# Patient Record
Sex: Female | Born: 1990 | Race: White | Hispanic: No | Marital: Single | Smoking: Current every day smoker
Health system: Southern US, Community
[De-identification: ages and names within clinical notes are randomized; demographics above are authoritative.]

## PROBLEM LIST (undated history)

## (undated) DIAGNOSIS — R51 Headache: Secondary | ICD-10-CM

## (undated) DIAGNOSIS — F329 Major depressive disorder, single episode, unspecified: Secondary | ICD-10-CM

## (undated) DIAGNOSIS — M1990 Unspecified osteoarthritis, unspecified site: Secondary | ICD-10-CM

## (undated) DIAGNOSIS — F603 Borderline personality disorder: Secondary | ICD-10-CM

## (undated) DIAGNOSIS — K219 Gastro-esophageal reflux disease without esophagitis: Secondary | ICD-10-CM

## (undated) DIAGNOSIS — F4310 Post-traumatic stress disorder, unspecified: Secondary | ICD-10-CM

## (undated) DIAGNOSIS — F319 Bipolar disorder, unspecified: Secondary | ICD-10-CM

## (undated) DIAGNOSIS — C801 Malignant (primary) neoplasm, unspecified: Secondary | ICD-10-CM

## (undated) DIAGNOSIS — F32A Depression, unspecified: Secondary | ICD-10-CM

## (undated) DIAGNOSIS — J45909 Unspecified asthma, uncomplicated: Secondary | ICD-10-CM

## (undated) HISTORY — PX: CHOLECYSTECTOMY: SHX55

## (undated) HISTORY — PX: WISDOM TOOTH EXTRACTION: SHX21

## (undated) HISTORY — PX: LEFT OOPHORECTOMY: SHX1961

## (undated) HISTORY — PX: NO PAST SURGERIES: SHX2092

---

## 2011-01-22 ENCOUNTER — Encounter: Payer: Self-pay | Admitting: *Deleted

## 2011-01-22 DIAGNOSIS — R109 Unspecified abdominal pain: Secondary | ICD-10-CM | POA: Insufficient documentation

## 2011-01-22 NOTE — ED Notes (Signed)
Pt in c/o generalized abd pain, worse is RUQ, last period was in July, pt has not taken pregnancy test

## 2011-01-23 ENCOUNTER — Emergency Department (HOSPITAL_COMMUNITY)
Admission: EM | Admit: 2011-01-23 | Discharge: 2011-01-23 | Discharge status: Against Medical Advice | Payer: BC Managed Care – PPO | Attending: Emergency Medicine | Admitting: Emergency Medicine

## 2011-01-23 HISTORY — DX: Depression, unspecified: F32.A

## 2011-01-23 HISTORY — DX: Major depressive disorder, single episode, unspecified: F32.9

## 2013-02-10 NOTE — L&D Delivery Note (Signed)
Delivery Note Called for standby delivery. Patient recently brought up from MAU with advanced dilation.   She was involuntarily pushing and screaming. FHR was noted to have decelerations which occurred after contractions (which were not tracing well, but the timing was observed after contractions noted by me).  Turned to other side, IV bolus, and oxygen administered. ISE placed. Good acceleration with application/scalp stimulation.  Artelia Laroche notified, and Dr Garwin Brothers called.  Spoke with Dr Garwin Brothers on phone.    Shortly after patient turned, vertex rotated from LOT to direct OA, and began to crown.  At 9:36 PM a viable and healthy female was delivered via Vaginal, Spontaneous Delivery (Presentation: Middle Occiput Anterior).  APGAR: 8, 9; weight .   Placenta status: Intact, Spontaneous.  Cord: 3 vessels with the following complications: Nuchal cord x 2.    Anesthesia:  none Episiotomy: none Lacerations: 2nd degree Suture Repair: Suturing/Repair done by T. Mel Almond CNM Est. Blood Loss (mL): 200  Mom to postpartum.  Baby to Couplet care / Skin to Skin.  [facility] 10/22/2013, 9:47 PM    Addendum:   In another delivery with patient arrival - stand-by by in-house faculty practice CNM. Dr Garwin Brothers called by nursing staff -notified of impending birth / FHR decels / CNM in birth. Assumed care after delivery of placenta. Perineal assessment - 2nd degree laceration. 1% xylocaine local anesthesia for repair. 3-0 vicryl vaginal and deep perineal followed by 4-0 vicryl subcuticular repair.  + GBS not treated due to advanced dilation  / FSE applied by faculty CNM - nursery aware   Artelia Laroche CNM [facility]

## 2013-09-21 LAB — OB RESULTS CONSOLE HIV ANTIBODY (ROUTINE TESTING): HIV: NONREACTIVE

## 2013-09-21 LAB — OB RESULTS CONSOLE RPR: RPR: NONREACTIVE

## 2013-09-21 LAB — OB RESULTS CONSOLE GC/CHLAMYDIA
Chlamydia: NEGATIVE
Gonorrhea: NEGATIVE

## 2013-09-21 LAB — OB RESULTS CONSOLE RUBELLA ANTIBODY, IGM: Rubella: IMMUNE

## 2013-09-21 LAB — OB RESULTS CONSOLE HEPATITIS B SURFACE ANTIGEN: Hepatitis B Surface Ag: NEGATIVE

## 2013-09-21 LAB — OB RESULTS CONSOLE ABO/RH: RH Type: POSITIVE

## 2013-09-21 LAB — OB RESULTS CONSOLE ANTIBODY SCREEN: Antibody Screen: NEGATIVE

## 2013-10-04 ENCOUNTER — Inpatient Hospital Stay (HOSPITAL_COMMUNITY)
Admission: AD | Admit: 2013-10-04 | Discharge: 2013-10-05 | Disposition: A | Discharge status: Home / Self Care | Payer: BC Managed Care – PPO | Source: Ambulatory Visit | Attending: Obstetrics & Gynecology | Admitting: Obstetrics & Gynecology

## 2013-10-04 ENCOUNTER — Encounter (HOSPITAL_COMMUNITY): Payer: Self-pay | Admitting: *Deleted

## 2013-10-04 DIAGNOSIS — Z87891 Personal history of nicotine dependence: Secondary | ICD-10-CM | POA: Insufficient documentation

## 2013-10-04 DIAGNOSIS — O479 False labor, unspecified: Secondary | ICD-10-CM | POA: Diagnosis not present

## 2013-10-04 HISTORY — DX: Bipolar disorder, unspecified: F31.9

## 2013-10-04 HISTORY — DX: Headache: R51

## 2013-10-04 HISTORY — DX: Unspecified asthma, uncomplicated: J45.909

## 2013-10-04 LAB — WET PREP, GENITAL
Clue Cells Wet Prep HPF POC: NONE SEEN
Trich, Wet Prep: NONE SEEN
Yeast Wet Prep HPF POC: NONE SEEN

## 2013-10-04 LAB — AMNISURE RUPTURE OF MEMBRANE (ROM) NOT AT ARMC: Amnisure ROM: NEGATIVE

## 2013-10-04 NOTE — MAU Provider Note (Signed)
Chief Complaint:  Labor Eval   Ashley Moyer is a 23 y.o.  G1P0 with IUP at [redacted]w[redacted]d presenting for Labor Eval . Patient states she has been having  irregular contractions since 9am today, none vaginal bleeding, continuous leaking of fluid since this afternoon, with active fetal movement.  She report hx of PTL prior to transferring her care to [facility] - but states she was treated with two shots. She denies any fevers, chills, dysuria, vaginal pain/discharge/bleeding.   Menstrual History: OB History   Grav Para Term Preterm Abortions TAB SAB Ect Mult Living   1               No LMP recorded. Patient is pregnant.      Past Medical History  Diagnosis Date  . Depression   . Asthma   . Headache(784.0)   . Bipolar 1 disorder     Past Surgical History  Procedure Laterality Date  . No past surgeries      Family History  Problem Relation Age of Onset  . Cancer Mother   . Cancer Maternal Aunt   . Cancer Maternal Grandmother     History  Substance Use Topics  . Smoking status: Former Research scientist (life sciences)  . Smokeless tobacco: Not on file  . Alcohol Use: No     No Known Allergies  Prescriptions prior to admission  Medication Sig Dispense Refill  . OXcarbazepine (TRILEPTAL PO) Take by mouth.          Review of Systems - Negative except for what is mentioned in HPI.  Physical Exam  Blood pressure 117/71, pulse 83, temperature 99 F (37.2 C), temperature source Oral, height 5\' 6"  (1.676 m), weight 114.306 kg (252 lb). GENERAL: Well-developed, well-nourished female in no acute distress.  LUNGS: Clear to auscultation bilaterally.  HEART: Regular rate and rhythm. ABDOMEN: Soft, nontender, nondistended, gravid.  EXTREMITIES: Nontender, no edema, 2+ distal pulses. FHT:  Baseline rate 145 bpm   Variability moderate  Accelerations present   Decelerations none Contractions: Irregular; > 10 mins Speculum Exam: Ext genitalia: wnl; Vaginal discharge: thick white; Cervix: wnl w/o leaking of  fluid Dilation: Fingertip Effacement (%): 50 Station: -3 Presentation: Vertex Exam by:: Ashley Glassing RN   Labs: No results found for this or any previous visit (from the past 24 hour(s)).  Imaging Studies:  No results found.  Assessment: Ashley Moyer is  23 y.o. G1P0 at [redacted]w[redacted]d presents with Labor Ashley Moyer, Speculum and Amnisure all negative. FHT is Cat 1. No cervical change x 1 hour. Wet mount negative  Plan: Braxton-Hicks contractions - Follow-up with regular OB visit/prenatal care  Ashley Moyer 8/25/201511:02 PM  OB fellow attestation:  I have seen and examined this patient; I agree with above documentation in the resident's note.   Ashley Moyer is a 23 y.o. G1P0 reporting leakage of fluid +FM, denies LOF, VB, contractions, vaginal discharge.  PE: BP 122/66  Pulse 72  Temp(Src) 97.9 F (36.6 C) (Oral)  Resp 18  Ht 5\' 6"  (1.676 m)  Wt 252 lb (114.306 kg)  BMI 40.69 kg/m2 Gen: calm comfortable, NAD Resp: normal effort, no distress Abd: gravid  ROS, labs, PMH reviewed NST reactive  Plan: braxton hicks - fetal kick counts reinforced, preterm labor precautions - continue routine follow up in OB clinic - fern, spec, amnisure neg  Ashley Pestka ROCIO, MD 3:32 AM

## 2013-10-04 NOTE — Discharge Instructions (Signed)
Braxton Hicks Contractions °Contractions of the uterus can occur throughout pregnancy. Contractions are not always a sign that you are in labor.  °WHAT ARE BRAXTON HICKS CONTRACTIONS?  °Contractions that occur before labor are called Braxton Hicks contractions, or false labor. Toward the end of pregnancy (32-34 weeks), these contractions can develop more often and may become more forceful. This is not true labor because these contractions do not result in opening (dilatation) and thinning of the cervix. They are sometimes difficult to tell apart from true labor because these contractions can be forceful and people have different pain tolerances. You should not feel embarrassed if you go to the hospital with false labor. Sometimes, the only way to tell if you are in true labor is for your health care provider to look for changes in the cervix. °If there are no prenatal problems or other health problems associated with the pregnancy, it is completely safe to be sent home with false labor and await the onset of true labor. °HOW CAN YOU TELL THE DIFFERENCE BETWEEN TRUE AND FALSE LABOR? °False Labor °· The contractions of false labor are usually shorter and not as hard as those of true labor.   °· The contractions are usually irregular.   °· The contractions are often felt in the front of the lower abdomen and in the groin.   °· The contractions may go away when you walk around or change positions while lying down.   °· The contractions get weaker and are shorter lasting as time goes on.   °· The contractions do not usually become progressively stronger, regular, and closer together as with true labor.   °True Labor °· Contractions in true labor last 30-70 seconds, become very regular, usually become more intense, and increase in frequency.   °· The contractions do not go away with walking.   °· The discomfort is usually felt in the top of the uterus and spreads to the lower abdomen and low back.   °· True labor can be  determined by your health care provider with an exam. This will show that the cervix is dilating and getting thinner.   °WHAT TO REMEMBER °· Keep up with your usual exercises and follow other instructions given by your health care provider.   °· Take medicines as directed by your health care provider.   °· Keep your regular prenatal appointments.   °· Eat and drink lightly if you think you are going into labor.   °· If Braxton Hicks contractions are making you uncomfortable:   °¨ Change your position from lying down or resting to walking, or from walking to resting.   °¨ Sit and rest in a tub of warm water.   °¨ Drink 2-3 glasses of water. Dehydration may cause these contractions.   °¨ Do slow and deep breathing several times an hour.   °WHEN SHOULD I SEEK IMMEDIATE MEDICAL CARE? °Seek immediate medical care if: °· Your contractions become stronger, more regular, and closer together.   °· You have fluid leaking or gushing from your vagina.   °· You have a fever.   °· You pass blood-tinged mucus.   °· You have vaginal bleeding.   °· You have continuous abdominal pain.   °· You have low back pain that you never had before.   °· You feel your baby's head pushing down and causing pelvic pressure.   °· Your baby is not moving as much as it used to.   °Document Released: 01/27/2005 Document Revised: 02/01/2013 Document Reviewed: 11/08/2012 °ExitCare® Patient Information ©2015 [employer]. This information is not intended to replace advice given to you by your health care   provider. Make sure you discuss any questions you have with your health care provider. ° °

## 2013-10-04 NOTE — MAU Note (Signed)
C/o SROM @ 2000;

## 2013-10-05 DIAGNOSIS — O479 False labor, unspecified: Secondary | ICD-10-CM | POA: Diagnosis not present

## 2013-10-06 NOTE — MAU Provider Note (Signed)
Attestation of Attending Supervision of Obstetric Fellow: Evaluation and management procedures were performed by the Obstetric Fellow under my supervision and collaboration.  I have reviewed the Obstetric Fellow's note and chart, and I agree with the management and plan.  Verita Schneiders, MD, [place] Attending [place], [facility]

## 2013-10-17 ENCOUNTER — Inpatient Hospital Stay (HOSPITAL_COMMUNITY)
Admission: AD | Admit: 2013-10-17 | Discharge: 2013-10-17 | Disposition: A | Discharge status: Home / Self Care | Payer: BC Managed Care – PPO | Source: Ambulatory Visit | Attending: Obstetrics and Gynecology | Admitting: Obstetrics and Gynecology

## 2013-10-17 DIAGNOSIS — O99891 Other specified diseases and conditions complicating pregnancy: Secondary | ICD-10-CM | POA: Diagnosis not present

## 2013-10-17 DIAGNOSIS — O9989 Other specified diseases and conditions complicating pregnancy, childbirth and the puerperium: Principal | ICD-10-CM

## 2013-10-17 LAB — AMNISURE RUPTURE OF MEMBRANE (ROM) NOT AT ARMC: Amnisure ROM: NEGATIVE

## 2013-10-17 NOTE — Progress Notes (Signed)
Discharge instructions given. Patient states that "nobody can tell her what the fluid she is leaking is". I explained to the patient that it can be urine. I offered for patient to wait for Artelia Laroche, CNM to come to MAU to see her. She refused and stated that last time she waited that she was here past midnight. Patient appeared very upset and removed fetal monitors herself. I tried to comfort patient explaining to her that I understand her frustration and offered to provide her with any information I possibly can. She states that she has an appointment with her Dr this Friday.

## 2013-10-17 NOTE — MAU Note (Signed)
?  SROM after intercourse around 1945;

## 2013-10-21 ENCOUNTER — Inpatient Hospital Stay (HOSPITAL_COMMUNITY)
Admission: AD | Admit: 2013-10-21 | Discharge: 2013-10-21 | Disposition: A | Discharge status: Home / Self Care | Payer: BC Managed Care – PPO | Source: Ambulatory Visit | Attending: Obstetrics and Gynecology | Admitting: Obstetrics and Gynecology

## 2013-10-21 ENCOUNTER — Encounter (HOSPITAL_COMMUNITY): Payer: Self-pay | Admitting: *Deleted

## 2013-10-21 DIAGNOSIS — O479 False labor, unspecified: Secondary | ICD-10-CM | POA: Insufficient documentation

## 2013-10-21 MED ORDER — ZOLPIDEM TARTRATE 5 MG PO TABS
10.0000 mg | ORAL_TABLET | Freq: Once | ORAL | Status: AC
  Administered 2013-10-21: 10 mg via ORAL
  Filled 2013-10-21: qty 2

## 2013-10-21 NOTE — Discharge Instructions (Signed)
Braxton Hicks Contractions Contractions of the uterus can occur throughout pregnancy. Contractions are not always a sign that you are in labor.  WHAT ARE BRAXTON HICKS CONTRACTIONS?  Contractions that occur before labor are called Braxton Hicks contractions, or false labor. Toward the end of pregnancy (32-34 weeks), these contractions can develop more often and may become more forceful. This is not true labor because these contractions do not result in opening (dilatation) and thinning of the cervix. They are sometimes difficult to tell apart from true labor because these contractions can be forceful and people have different pain tolerances. You should not feel embarrassed if you go to the hospital with false labor. Sometimes, the only way to tell if you are in true labor is for your health care provider to look for changes in the cervix. If there are no prenatal problems or other health problems associated with the pregnancy, it is completely safe to be sent home with false labor and await the onset of true labor. HOW CAN YOU TELL THE DIFFERENCE BETWEEN TRUE AND FALSE LABOR? False Labor  The contractions of false labor are usually shorter and not as hard as those of true labor.   The contractions are usually irregular.   The contractions are often felt in the front of the lower abdomen and in the groin.   The contractions may go away when you walk around or change positions while lying down.   The contractions get weaker and are shorter lasting as time goes on.   The contractions do not usually become progressively stronger, regular, and closer together as with true labor.  True Labor  Contractions in true labor last 30-70 seconds, become very regular, usually become more intense, and increase in frequency.   The contractions do not go away with walking.   The discomfort is usually felt in the top of the uterus and spreads to the lower abdomen and low back.   True labor can be  determined by your health care provider with an exam. This will show that the cervix is dilating and getting thinner.  WHAT TO REMEMBER  Keep up with your usual exercises and follow other instructions given by your health care provider.   Take medicines as directed by your health care provider.   Keep your regular prenatal appointments.   Eat and drink lightly if you think you are going into labor.   If Braxton Hicks contractions are making you uncomfortable:   Change your position from lying down or resting to walking, or from walking to resting.   Sit and rest in a tub of warm water.   Drink 2-3 glasses of water. Dehydration may cause these contractions.   Do slow and deep breathing several times an hour.  WHEN SHOULD I SEEK IMMEDIATE MEDICAL CARE? Seek immediate medical care if:  Your contractions become stronger, more regular, and closer together.   You have fluid leaking or gushing from your vagina.   You have a fever.   You pass blood-tinged mucus.   You have vaginal bleeding.   You have continuous abdominal pain.   You have low back pain that you never had before.   You feel your baby's head pushing down and causing pelvic pressure.   Your baby is not moving as much as it used to.  Document Released: 01/27/2005 Document Revised: 02/01/2013 Document Reviewed: 11/08/2012 ExitCare Patient Information 2015 [employer]. This information is not intended to replace advice given to you by your health care   provider. Make sure you discuss any questions you have with your health care provider.  Fetal Movement Counts Patient Name: __________________________________________________ Patient Due Date: ____________________ Performing a fetal movement count is highly recommended in high-risk pregnancies, but it is good for every pregnant woman to do. Your health care provider may ask you to start counting fetal movements at 28 weeks of the pregnancy. Fetal  movements often increase:  After eating a full meal.  After physical activity.  After eating or drinking something sweet or cold.  At rest. Pay attention to when you feel the baby is most active. This will help you notice a pattern of your baby's sleep and wake cycles and what factors contribute to an increase in fetal movement. It is important to perform a fetal movement count at the same time each day when your baby is normally most active.  HOW TO COUNT FETAL MOVEMENTS 1. Find a quiet and comfortable area to sit or lie down on your left side. Lying on your left side provides the best blood and oxygen circulation to your baby. 2. Write down the day and time on a sheet of paper or in a journal. 3. Start counting kicks, flutters, swishes, rolls, or jabs in a 2-hour period. You should feel at least 10 movements within 2 hours. 4. If you do not feel 10 movements in 2 hours, wait 2-3 hours and count again. Look for a change in the pattern or not enough counts in 2 hours. SEEK MEDICAL CARE IF:  You feel less than 10 counts in 2 hours, tried twice.  There is no movement in over an hour.  The pattern is changing or taking longer each day to reach 10 counts in 2 hours.  You feel the baby is not moving as he or she usually does. Date: ____________ Movements: ____________ Start time: ____________ Finish time: ____________  Date: ____________ Movements: ____________ Start time: ____________ Finish time: ____________ Date: ____________ Movements: ____________ Start time: ____________ Finish time: ____________ Date: ____________ Movements: ____________ Start time: ____________ Finish time: ____________ Date: ____________ Movements: ____________ Start time: ____________ Finish time: ____________ Date: ____________ Movements: ____________ Start time: ____________ Finish time: ____________ Date: ____________ Movements: ____________ Start time: ____________ Finish time: ____________ Date: ____________  Movements: ____________ Start time: ____________ Finish time: ____________  Date: ____________ Movements: ____________ Start time: ____________ Finish time: ____________ Date: ____________ Movements: ____________ Start time: ____________ Finish time: ____________ Date: ____________ Movements: ____________ Start time: ____________ Finish time: ____________ Date: ____________ Movements: ____________ Start time: ____________ Finish time: ____________ Date: ____________ Movements: ____________ Start time: ____________ Finish time: ____________ Date: ____________ Movements: ____________ Start time: ____________ Finish time: ____________ Date: ____________ Movements: ____________ Start time: ____________ Finish time: ____________  Date: ____________ Movements: ____________ Start time: ____________ Finish time: ____________ Date: ____________ Movements: ____________ Start time: ____________ Finish time: ____________ Date: ____________ Movements: ____________ Start time: ____________ Finish time: ____________ Date: ____________ Movements: ____________ Start time: ____________ Finish time: ____________ Date: ____________ Movements: ____________ Start time: ____________ Finish time: ____________ Date: ____________ Movements: ____________ Start time: ____________ Finish time: ____________ Date: ____________ Movements: ____________ Start time: ____________ Finish time: ____________  Date: ____________ Movements: ____________ Start time: ____________ Finish time: ____________ Date: ____________ Movements: ____________ Start time: ____________ Finish time: ____________ Date: ____________ Movements: ____________ Start time: ____________ Finish time: ____________ Date: ____________ Movements: ____________ Start time: ____________ Finish time: ____________ Date: ____________ Movements: ____________ Start time: ____________ Finish time: ____________ Date: ____________ Movements: ____________ Start time:  ____________ Finish time: ____________ Date: ____________ Movements:   ____________ Start time: ____________ Finish time: ____________  Date: ____________ Movements: ____________ Start time: ____________ Finish time: ____________ Date: ____________ Movements: ____________ Start time: ____________ Finish time: ____________ Date: ____________ Movements: ____________ Start time: ____________ Finish time: ____________ Date: ____________ Movements: ____________ Start time: ____________ Finish time: ____________ Date: ____________ Movements: ____________ Start time: ____________ Finish time: ____________ Date: ____________ Movements: ____________ Start time: ____________ Finish time: ____________ Date: ____________ Movements: ____________ Start time: ____________ Finish time: ____________  Date: ____________ Movements: ____________ Start time: ____________ Finish time: ____________ Date: ____________ Movements: ____________ Start time: ____________ Finish time: ____________ Date: ____________ Movements: ____________ Start time: ____________ Finish time: ____________ Date: ____________ Movements: ____________ Start time: ____________ Finish time: ____________ Date: ____________ Movements: ____________ Start time: ____________ Finish time: ____________ Date: ____________ Movements: ____________ Start time: ____________ Finish time: ____________ Date: ____________ Movements: ____________ Start time: ____________ Finish time: ____________  Date: ____________ Movements: ____________ Start time: ____________ Finish time: ____________ Date: ____________ Movements: ____________ Start time: ____________ Finish time: ____________ Date: ____________ Movements: ____________ Start time: ____________ Finish time: ____________ Date: ____________ Movements: ____________ Start time: ____________ Finish time: ____________ Date: ____________ Movements: ____________ Start time: ____________ Finish time: ____________ Date:  ____________ Movements: ____________ Start time: ____________ Finish time: ____________ Date: ____________ Movements: ____________ Start time: ____________ Finish time: ____________  Date: ____________ Movements: ____________ Start time: ____________ Finish time: ____________ Date: ____________ Movements: ____________ Start time: ____________ Finish time: ____________ Date: ____________ Movements: ____________ Start time: ____________ Finish time: ____________ Date: ____________ Movements: ____________ Start time: ____________ Finish time: ____________ Date: ____________ Movements: ____________ Start time: ____________ Finish time: ____________ Date: ____________ Movements: ____________ Start time: ____________ Finish time: ____________ Document Released: 02/26/2006 Document Revised: 06/13/2013 Document Reviewed: 11/24/2011 ExitCare Patient Information 2015 [employer]. This information is not intended to replace advice given to you by your health care provider. Make sure you discuss any questions you have with your health care provider.  

## 2013-10-21 NOTE — MAU Note (Signed)
Pt crying, her mother states she has been contracting since yesterday, passed yellow mucus today.  Was seen in MD office today - was 3/85.  Denies bleeding or LOF.

## 2013-10-21 NOTE — MAU Note (Signed)
Pt tired and upset. Removed fetal monitors from herself stating that she's in pain, irritated and hungry. Wants to know how much longer for ultrasound. Consoled patient and apologized about wait time but ensured that she would be taken as soon as the ultrasound tech is ready for her.

## 2013-10-21 NOTE — MAU Note (Signed)
Sunday Corn CNM notified of reactive tracing. Pt no longer needs BPP-discontinued. Pt ready to go home. Will discharge.

## 2013-10-22 ENCOUNTER — Inpatient Hospital Stay (HOSPITAL_COMMUNITY)
Admission: AD | Admit: 2013-10-22 | Discharge: 2013-10-24 | DRG: 775 | Disposition: A | Discharge status: Home / Self Care | Payer: BC Managed Care – PPO | Source: Ambulatory Visit | Attending: Obstetrics and Gynecology | Admitting: Obstetrics and Gynecology

## 2013-10-22 ENCOUNTER — Encounter (HOSPITAL_COMMUNITY): Payer: Self-pay | Admitting: *Deleted

## 2013-10-22 DIAGNOSIS — Z8659 Personal history of other mental and behavioral disorders: Secondary | ICD-10-CM

## 2013-10-22 DIAGNOSIS — F1911 Other psychoactive substance abuse, in remission: Secondary | ICD-10-CM | POA: Diagnosis present

## 2013-10-22 DIAGNOSIS — Z22330 Carrier of Group B streptococcus: Secondary | ICD-10-CM

## 2013-10-22 DIAGNOSIS — O99892 Other specified diseases and conditions complicating childbirth: Secondary | ICD-10-CM | POA: Diagnosis present

## 2013-10-22 DIAGNOSIS — Z87891 Personal history of nicotine dependence: Secondary | ICD-10-CM | POA: Diagnosis not present

## 2013-10-22 DIAGNOSIS — IMO0002 Reserved for concepts with insufficient information to code with codable children: Secondary | ICD-10-CM

## 2013-10-22 DIAGNOSIS — O479 False labor, unspecified: Secondary | ICD-10-CM | POA: Diagnosis present

## 2013-10-22 DIAGNOSIS — O9989 Other specified diseases and conditions complicating pregnancy, childbirth and the puerperium: Secondary | ICD-10-CM

## 2013-10-22 DIAGNOSIS — Z9149 Other personal history of psychological trauma, not elsewhere classified: Secondary | ICD-10-CM

## 2013-10-22 LAB — CBC
HCT: 41.5 % (ref 36.0–46.0)
Hemoglobin: 14.6 g/dL (ref 12.0–15.0)
MCH: 33.3 pg (ref 26.0–34.0)
MCHC: 35.2 g/dL (ref 30.0–36.0)
MCV: 94.5 fL (ref 78.0–100.0)
Platelets: 292 10*3/uL (ref 150–400)
RBC: 4.39 MIL/uL (ref 3.87–5.11)
RDW: 12.3 % (ref 11.5–15.5)
WBC: 15.4 10*3/uL — ABNORMAL HIGH (ref 4.0–10.5)

## 2013-10-22 LAB — OB RESULTS CONSOLE GBS: GBS: POSITIVE

## 2013-10-22 MED ORDER — DIPHENHYDRAMINE HCL 25 MG PO CAPS
25.0000 mg | ORAL_CAPSULE | Freq: Four times a day (QID) | ORAL | Status: DC | PRN
Start: 2013-10-22 — End: 2013-10-24

## 2013-10-22 MED ORDER — ACETAMINOPHEN 325 MG PO TABS: 650.0000 mg | ORAL_TABLET | ORAL | Status: DC | PRN

## 2013-10-22 MED ORDER — LANOLIN HYDROUS EX OINT: TOPICAL_OINTMENT | CUTANEOUS | Status: DC | PRN

## 2013-10-22 MED ORDER — OXYCODONE-ACETAMINOPHEN 5-325 MG PO TABS: 2.0000 | ORAL_TABLET | ORAL | Status: DC | PRN

## 2013-10-22 MED ORDER — BENZOCAINE-MENTHOL 20-0.5 % EX AERO
1.0000 "application " | INHALATION_SPRAY | CUTANEOUS | Status: DC | PRN
  Filled 2013-10-22 (×2): qty 56

## 2013-10-22 MED ORDER — DIBUCAINE 1 % RE OINT
1.0000 "application " | TOPICAL_OINTMENT | RECTAL | Status: DC | PRN
  Filled 2013-10-22: qty 28

## 2013-10-22 MED ORDER — SENNOSIDES-DOCUSATE SODIUM 8.6-50 MG PO TABS: 2.0000 | ORAL_TABLET | ORAL | Status: DC

## 2013-10-22 MED ORDER — WITCH HAZEL-GLYCERIN EX PADS: 1.0000 "application " | MEDICATED_PAD | CUTANEOUS | Status: DC | PRN

## 2013-10-22 MED ORDER — SODIUM CHLORIDE 0.9 % IV SOLN
2.0000 g | Freq: Once | INTRAVENOUS | Status: AC
  Administered 2013-10-22: 2 g via INTRAVENOUS
  Filled 2013-10-22: qty 2000

## 2013-10-22 MED ORDER — CITRIC ACID-SODIUM CITRATE 334-500 MG/5ML PO SOLN: 30.0000 mL | ORAL | Status: DC | PRN

## 2013-10-22 MED ORDER — OXYTOCIN BOLUS FROM INFUSION
500.0000 mL | INTRAVENOUS | Status: DC
  Administered 2013-10-22: 500 mL via INTRAVENOUS

## 2013-10-22 MED ORDER — LACTATED RINGERS IV SOLN: 500.0000 mL | INTRAVENOUS | Status: DC | PRN

## 2013-10-22 MED ORDER — SIMETHICONE 80 MG PO CHEW: 80.0000 mg | CHEWABLE_TABLET | ORAL | Status: DC | PRN

## 2013-10-22 MED ORDER — OXYCODONE-ACETAMINOPHEN 5-325 MG PO TABS: 1.0000 | ORAL_TABLET | ORAL | Status: DC | PRN

## 2013-10-22 MED ORDER — FLEET ENEMA 7-19 GM/118ML RE ENEM: 1.0000 | ENEMA | RECTAL | Status: DC | PRN

## 2013-10-22 MED ORDER — LACTATED RINGERS IV SOLN: INTRAVENOUS | Status: DC

## 2013-10-22 MED ORDER — LIDOCAINE HCL (PF) 1 % IJ SOLN
30.0000 mL | INTRAMUSCULAR | Status: DC | PRN
  Administered 2013-10-22: 30 mL via SUBCUTANEOUS
  Filled 2013-10-22: qty 30

## 2013-10-22 MED ORDER — OXYTOCIN 40 UNITS IN LACTATED RINGERS INFUSION - SIMPLE MED
INTRAVENOUS | Status: AC
  Filled 2013-10-22: qty 1000

## 2013-10-22 MED ORDER — IBUPROFEN 600 MG PO TABS: 600.0000 mg | ORAL_TABLET | Freq: Four times a day (QID) | ORAL | Status: DC

## 2013-10-22 MED ORDER — OXYTOCIN 40 UNITS IN LACTATED RINGERS INFUSION - SIMPLE MED
62.5000 mL/h | INTRAVENOUS | Status: DC
Start: 2013-10-22 — End: 2013-10-22
  Administered 2013-10-22: 62.5 mL/h via INTRAVENOUS

## 2013-10-22 MED ORDER — ONDANSETRON HCL 4 MG/2ML IJ SOLN: 4.0000 mg | Freq: Four times a day (QID) | INTRAMUSCULAR | Status: DC | PRN

## 2013-10-22 MED ORDER — LIDOCAINE HCL (PF) 1 % IJ SOLN
INTRAMUSCULAR | Status: AC
  Filled 2013-10-22: qty 30
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
## 2013-10-22 NOTE — Progress Notes (Signed)
Pt breast feeding in wheelchair this is second time breastfeeding, first time was for 107min.  Pt's family in and brought food

## 2013-10-23 LAB — CBC
HCT: 34.9 % — ABNORMAL LOW (ref 36.0–46.0)
Hemoglobin: 12.2 g/dL (ref 12.0–15.0)
MCH: 33.3 pg (ref 26.0–34.0)
MCHC: 35 g/dL (ref 30.0–36.0)
MCV: 95.4 fL (ref 78.0–100.0)
Platelets: 242 10*3/uL (ref 150–400)
RBC: 3.66 MIL/uL — ABNORMAL LOW (ref 3.87–5.11)
RDW: 12.4 % (ref 11.5–15.5)
WBC: 14.7 10*3/uL — ABNORMAL HIGH (ref 4.0–10.5)

## 2013-10-23 LAB — ABO/RH: ABO/RH(D): A POS

## 2013-10-23 LAB — RPR

## 2013-10-23 MED ORDER — ACETAMINOPHEN 325 MG PO TABS: 650.0000 mg | ORAL_TABLET | Freq: Four times a day (QID) | ORAL | Status: DC | PRN

## 2013-10-23 MED ORDER — ONDANSETRON HCL 4 MG PO TABS
4.0000 mg | ORAL_TABLET | Freq: Three times a day (TID) | ORAL | Status: DC | PRN
  Administered 2013-10-23: 4 mg via ORAL
  Filled 2013-10-23: qty 1

## 2013-10-23 MED ORDER — LACTATED RINGERS IV SOLN: Freq: Once | INTRAVENOUS | Status: DC

## 2013-10-23 MED ORDER — NALBUPHINE HCL 10 MG/ML IJ SOLN
5.0000 mg | INTRAMUSCULAR | Status: AC | PRN
  Administered 2013-10-23: 5 mg via INTRAVENOUS
  Filled 2013-10-23: qty 1
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
## 2013-10-23 NOTE — Lactation Note (Signed)
This note was copied from the chart of [place]. Lactation Consultation Note  Patient Name: Boy Reanna Scoggin OJJKK'X Date: 10/23/2013 Reason for consult: Initial assessment Baby 22 hours of life. Mom reports that baby was nursing well at first, but has been sleepy. Mom's breast are wide-spaced and hypoplastic. Mom reports baby has been to breast at least 15 times during the first 22 hours. Mom able to massage breast and hand express drops of colostrum from both breast. Assisted mom to spoon-feed drops to baby with spoon. Baby tolerated well. Enc mom to continue to offer lots of STS, breast with cues, and at least 8-12 times/24 hours. Discussed cluster-feeding with mom. Mom given [facility] brochure, aware of OP/BFSG and community resources. Enc mom to call for assistance with BF as needed. Discussed assessment and plan with patient's [facility] RN Collie Siad.   Maternal Data Has patient been taught Hand Expression?: Yes Does the patient have breastfeeding experience prior to this delivery?: No  Feeding Feeding Type: Breast Fed  LATCH Score/Interventions Latch: Too sleepy or reluctant, no latch achieved, no sucking elicited. Intervention(s): Skin to skin;Waking techniques  Audible Swallowing: None Intervention(s): Skin to skin;Hand expression Intervention(s): Alternate breast massage  Type of Nipple: Everted at rest and after stimulation  Comfort (Breast/Nipple): Soft / non-tender     Hold (Positioning): Assistance needed to correctly position infant at breast and maintain latch. Intervention(s): Breastfeeding basics reviewed;Support Pillows  LATCH Score: 5  Lactation Tools Discussed/Used     Consult Status Consult Status: Follow-up Date: 10/24/13 Follow-up type: In-patient    Inocente Salles 10/23/2013, 8:28 PM
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
## 2013-10-24 DIAGNOSIS — F1911 Other psychoactive substance abuse, in remission: Secondary | ICD-10-CM | POA: Diagnosis present

## 2013-10-24 DIAGNOSIS — IMO0002 Reserved for concepts with insufficient information to code with codable children: Secondary | ICD-10-CM

## 2013-10-24 DIAGNOSIS — Z9149 Other personal history of psychological trauma, not elsewhere classified: Secondary | ICD-10-CM

## 2013-10-24 DIAGNOSIS — Z8659 Personal history of other mental and behavioral disorders: Secondary | ICD-10-CM

## 2013-10-24 MED ORDER — OXYCODONE-ACETAMINOPHEN 5-325 MG PO TABS: 1.0000 | ORAL_TABLET | ORAL | Status: DC | PRN

## 2013-10-24 NOTE — Lactation Note (Signed)
This note was copied from the chart of [place]. Lactation Consultation Note  Patient Name: Ashley Moyer [id number] Date: 10/24/2013 Reason for consult: Follow-up assessment;Difficult latch Mom is having latch problems due to baby's reluctance to open mouth wide and she has soft breasts which collapse as he attempts to latch.  RN, Ashley Moyer had assisted using a #16 NS earlier but baby did not latch.  Baby varies between fussy/rooting behavior and sleepiness.  Mom is also exhausted.  LC assisted her to attempt latch to both breasts, using both football and cross-cradle positions.  Baby did achieve latch on (L) with NS for about 10 minutes, then briefly to (R) without NS. Large drops of colostrum in NS after feeding on (L) and some swallows heard when sucking was strong and rhythmical.  Mom has been provided a DEBP and will start using either before or after latch attempts, and will feed ebm (or formula, if needed) at each feeding.  Plan reviewed with RN and mom.  Baby does suck vigorously on a gloved finger but is reluctant to open mouth wide for suck exam and tends to tuck in lower lip and hold his tongue behind gumline until stimulated.    Maternal Data    Feeding Feeding Type: Breast Fed Length of feed: 15 min (10 on (L), then 5 on (R))  LATCH Score/Interventions Latch: Repeated attempts needed to sustain latch, nipple held in mouth throughout feeding, stimulation needed to elicit sucking reflex. Intervention(s): Skin to skin;Teach feeding cues;Waking techniques (repeated need for suck training and chin tug) Intervention(s): Adjust position;Assist with latch;Breast compression  Audible Swallowing: A few with stimulation Intervention(s): Skin to skin;Hand expression Intervention(s): Skin to skin;Hand expression;Alternate breast massage  Type of Nipple: Flat Intervention(s): Double electric pump  Comfort (Breast/Nipple): Soft / non-tender     Hold (Positioning): Assistance needed  to correctly position infant at breast and maintain latch. Intervention(s): Breastfeeding basics reviewed;Support Pillows;Position options;Skin to skin  LATCH Score: 6 (LC assisted and observed)  Lactation Tools Discussed/Used Tools: Nipple [place] Fuel;Pump Nipple shield size: 16 Breast pump type: Double-Electric Breast Pump WIC Program: Yes Pump Review: Milk Storage Initiated by:: RN, Ashley Moyer provided pump and will assist mom to pump later; mom exhausted after multiple latch attempts and wants to rest Date initiated:: 10/24/13 Suck training Signs of proper latch and effective sucking  Consult Status Consult Status: Follow-up Date: 10/24/13 Follow-up type: In-patient    Ashley Moyer [facility] 10/24/2013, 5:07 AM
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
## 2013-10-25 ENCOUNTER — Ambulatory Visit: Payer: Self-pay

## 2013-10-25 NOTE — Lactation Note (Signed)
This note was copied from the chart of [place]. Lactation Consultation Note  Patient Name: [name] WCHEN'I Date: 10/25/2013   Visited with Mom on day of discharge, baby 54 hrs old.  Mom resting comfortably in bed with baby sleeping.  Baby has had a couple small 20 ml bottles of Mom's pumped colostrum through the night, in addition to 2 formula bottles.  Baby since has fed using the nipple shield, for >30 mins on and off, hearing swallowing per Mom.  Colostrum noted in shield following feeding.  No complaint of nipple soreness, or trauma noted on nipples.  To obtain a DEBP from [facility] until her insurance sends her one.  Talked about importance of keeping baby skin to skin as much as possible.  Talked about the benefits of frequent feedings.  Engorgement prevention and treatment discussed.  Reviewed OP lactation services, and offered an OP appt, but Mom prefers to call when she gets home.  Volume parameter hand out given to Mom.  Recommended she call at next feeding for assessment prior to being discharged.  Baby's weight loss at 4%, and adequate urine output noted, last stool yesterday (green).  To call for OP appointment, or guidance prn.   [name] 10/25/2013, 9:52 AM

## 2013-12-12 ENCOUNTER — Encounter (HOSPITAL_COMMUNITY): Payer: Self-pay | Admitting: *Deleted

## 2014-01-02 ENCOUNTER — Emergency Department: Payer: Self-pay | Admitting: Emergency Medicine

## 2014-01-03 LAB — COMPREHENSIVE METABOLIC PANEL
Albumin: 3.3 g/dL — ABNORMAL LOW (ref 3.4–5.0)
Alkaline Phosphatase: 90 U/L
Anion Gap: 6 — ABNORMAL LOW (ref 7–16)
BUN: 17 mg/dL (ref 7–18)
Bilirubin,Total: 0.3 mg/dL (ref 0.2–1.0)
Calcium, Total: 8.2 mg/dL — ABNORMAL LOW (ref 8.5–10.1)
Chloride: 110 mmol/L — ABNORMAL HIGH (ref 98–107)
Co2: 27 mmol/L (ref 21–32)
Creatinine: 0.81 mg/dL (ref 0.60–1.30)
EGFR (African American): >60
EGFR (Non-African Amer.): >60
Glucose: 91 mg/dL (ref 65–99)
Osmolality: 286 (ref 275–301)
Potassium: 3.5 mmol/L (ref 3.5–5.1)
SGOT(AST): 34 U/L (ref 15–37)
SGPT (ALT): 45 U/L
Sodium: 143 mmol/L (ref 136–145)
Total Protein: 6.8 g/dL (ref 6.4–8.2)

## 2014-01-03 LAB — CBC WITH DIFFERENTIAL/PLATELET
Basophil #: 0 10*3/uL (ref 0.0–0.1)
Basophil %: 0.3 %
Eosinophil #: 0.1 10*3/uL (ref 0.0–0.7)
Eosinophil %: 1.5 %
HCT: 37.2 % (ref 35.0–47.0)
HGB: 12.4 g/dL (ref 12.0–16.0)
Lymphocyte #: 3.5 10*3/uL (ref 1.0–3.6)
Lymphocyte %: 42.9 %
MCH: 32.4 pg (ref 26.0–34.0)
MCHC: 33.3 g/dL (ref 32.0–36.0)
MCV: 97 fL (ref 80–100)
Monocyte #: 0.5 x10 3/mm (ref 0.2–0.9)
Monocyte %: 6.2 %
Neutrophil #: 3.9 10*3/uL (ref 1.4–6.5)
Neutrophil %: 49.1 %
Platelet: 246 10*3/uL (ref 150–440)
RBC: 3.82 10*6/uL (ref 3.80–5.20)
RDW: 12.8 % (ref 11.5–14.5)
WBC: 8.1 10*3/uL (ref 3.6–11.0)

## 2014-01-03 LAB — URINALYSIS, COMPLETE
Bilirubin,UR: NEGATIVE
Blood: NEGATIVE
Glucose,UR: NEGATIVE mg/dL (ref 0–75)
Ketone: NEGATIVE
Leukocyte Esterase: NEGATIVE
Nitrite: NEGATIVE
Ph: 6 (ref 4.5–8.0)
Protein: NEGATIVE
RBC,UR: 1 /HPF (ref 0–5)
Specific Gravity: 1.017 (ref 1.003–1.030)
Squamous Epithelial: 1
WBC UR: 8 /HPF (ref 0–5)

## 2014-01-03 LAB — LIPASE, BLOOD: Lipase: 123 U/L (ref 73–393)

## 2014-01-05 LAB — URINE CULTURE

## 2014-02-06 ENCOUNTER — Ambulatory Visit: Payer: Self-pay | Admitting: Surgery

## 2014-03-14 ENCOUNTER — Emergency Department: Payer: Self-pay | Admitting: Emergency Medicine

## 2014-03-14 LAB — CBC WITH DIFFERENTIAL/PLATELET
Basophil #: 0 10*3/uL (ref 0.0–0.1)
Basophil %: 0.5 %
Eosinophil #: 0.1 10*3/uL (ref 0.0–0.7)
Eosinophil %: 2 %
HCT: 38.1 % (ref 35.0–47.0)
HGB: 12.7 g/dL (ref 12.0–16.0)
Lymphocyte #: 1.8 10*3/uL (ref 1.0–3.6)
Lymphocyte %: 36.1 %
MCH: 31.7 pg (ref 26.0–34.0)
MCHC: 33.4 g/dL (ref 32.0–36.0)
MCV: 95 fL (ref 80–100)
Monocyte #: 0.4 x10 3/mm (ref 0.2–0.9)
Monocyte %: 7.5 %
Neutrophil #: 2.7 10*3/uL (ref 1.4–6.5)
Neutrophil %: 53.9 %
Platelet: 227 10*3/uL (ref 150–440)
RBC: 4.01 10*6/uL (ref 3.80–5.20)
RDW: 12.8 % (ref 11.5–14.5)
WBC: 5 10*3/uL (ref 3.6–11.0)

## 2014-03-14 LAB — COMPREHENSIVE METABOLIC PANEL
Albumin: 3.2 g/dL — ABNORMAL LOW (ref 3.4–5.0)
Alkaline Phosphatase: 76 U/L (ref 46–116)
Anion Gap: 6 — ABNORMAL LOW (ref 7–16)
BUN: 11 mg/dL (ref 7–18)
Bilirubin,Total: 0.4 mg/dL (ref 0.2–1.0)
Calcium, Total: 8.6 mg/dL (ref 8.5–10.1)
Chloride: 108 mmol/L — ABNORMAL HIGH (ref 98–107)
Co2: 27 mmol/L (ref 21–32)
Creatinine: 0.73 mg/dL (ref 0.60–1.30)
EGFR (African American): >60
EGFR (Non-African Amer.): >60
Glucose: 72 mg/dL (ref 65–99)
Osmolality: 279 (ref 275–301)
Potassium: 3.7 mmol/L (ref 3.5–5.1)
SGOT(AST): 21 U/L (ref 15–37)
SGPT (ALT): 28 U/L (ref 14–63)
Sodium: 141 mmol/L (ref 136–145)
Total Protein: 6.8 g/dL (ref 6.4–8.2)

## 2014-03-14 LAB — URINALYSIS, COMPLETE
Bacteria: NONE SEEN
Bilirubin,UR: NEGATIVE
Blood: NEGATIVE
Glucose,UR: NEGATIVE mg/dL (ref 0–75)
Ketone: NEGATIVE
Nitrite: NEGATIVE
Ph: 7 (ref 4.5–8.0)
Protein: NEGATIVE
RBC,UR: 2 /HPF (ref 0–5)
Specific Gravity: 1.018 (ref 1.003–1.030)
Squamous Epithelial: 29
WBC UR: 18 /HPF (ref 0–5)

## 2014-03-14 LAB — WET PREP, GENITAL

## 2014-03-14 LAB — HCG, QUANTITATIVE, PREGNANCY: Beta Hcg, Quant.: 481 m[IU]/mL — ABNORMAL HIGH

## 2014-03-14 LAB — LIPASE, BLOOD: Lipase: 108 U/L (ref 73–393)

## 2014-03-14 LAB — GC/CHLAMYDIA PROBE AMP

## 2014-03-15 LAB — URINE CULTURE

## 2014-04-12 ENCOUNTER — Emergency Department: Payer: Self-pay | Admitting: Emergency Medicine

## 2014-06-05 LAB — SURGICAL PATHOLOGY

## 2014-06-07 NOTE — Op Note (Signed)
PATIENT NAME:  Ashley Moyer, CAPES MR#:  [id number] DATE OF BIRTH:  05-25-1990  DATE OF PROCEDURE:  02/06/2014  ATTENDING SURGEON: Harrell Gave A. Xayden Linsey, MD   PREOPERATIVE DIAGNOSIS: Symptomatic cholelithiasis.   POSTOPERATIVE DIAGNOSIS: Symptomatic cholelithiasis.  PROCEDURE PERFORMED: Laparoscopic cholecystectomy.   ANESTHESIA: General.   ESTIMATED BLOOD LOSS: 15 ml  SPECIMEN: Gallbladder.  INDICATION FOR SURGERY: [name] is a pleasant 24 year old who presented with recurrent right upper quadrant pain and gallstones. I thought that her pain was biliary in nature, and therefore she underwent laparoscopic cholecystectomy.   DETAILS OF PROCEDURE: Informed consent was obtained. Ms. Nuzzo was brought to the operating room suite. She was induced. Endotracheal tube was placed. General anesthesia was administered. Her abdomen was prepped and draped in standard surgical fashion. A timeout was then performed correctly identifying the patient name, operative site, and procedure to be performed. A supraumbilical incision was made. It was deepened down to the fascia. The fascia was incised. The peritoneum was entered. Two stay sutures were placed through the fasciotomy. A Hasson trocar was placed in the abdomen. The abdomen was insufflated. An 11 mm epigastric and two 5 mm right subcostal trocars were placed. The gallbladder was then lifted over the dome of the liver. The cystic artery and cystic duct were dissected out. The critical view was obtained. The 3 structures were clipped and ligated. The gallbladder was then taken off the gallbladder fossa with cautery. Hemostasis was obtained. The gallbladder was then taken out with an Endo Catch bag through the supraumbilical incision. The gallbladder fossa was made hemostatic. After hemostasis was obtained, the abdomen was irrigated. All trocars were removed under direct visualization. The supraumbilical fascia was closed with a figure-of-eight 0 Vicryl.  All skin was closed with 4-0 Monocryl deep dermal sutures. Steri-Strips, Telfa gauze, and Tegaderm were used to complete the dressing. The patient was then awoken, extubated, and brought to the postanesthesia care unit. There were no immediate complications. Needle, sponge, and instrument count was correct at the end of the procedure.    ____________________________ Glena Norfolk. Alicia Seib, MD cal:ST D: 02/06/2014 18:08:45 ET T: 02/06/2014 21:47:51 ET JOB#: [id number]  cc: Harrell Gave A. Bexton Haak, MD, <Dictator> Floyde Parkins MD ELECTRONICALLY SIGNED 02/21/2014 19:46

## 2014-09-20 DIAGNOSIS — Z87898 Personal history of other specified conditions: Secondary | ICD-10-CM | POA: Insufficient documentation

## 2014-10-05 DIAGNOSIS — Z803 Family history of malignant neoplasm of breast: Secondary | ICD-10-CM | POA: Insufficient documentation

## 2015-09-14 ENCOUNTER — Emergency Department
Admission: EM | Admit: 2015-09-14 | Discharge: 2015-09-14 | Disposition: A | Discharge status: Home / Self Care | Payer: Medicaid Other | Attending: Emergency Medicine | Admitting: Emergency Medicine

## 2015-09-14 ENCOUNTER — Emergency Department: Payer: Medicaid Other

## 2015-09-14 DIAGNOSIS — Y999 Unspecified external cause status: Secondary | ICD-10-CM | POA: Diagnosis not present

## 2015-09-14 DIAGNOSIS — Z87891 Personal history of nicotine dependence: Secondary | ICD-10-CM | POA: Insufficient documentation

## 2015-09-14 DIAGNOSIS — J45909 Unspecified asthma, uncomplicated: Secondary | ICD-10-CM | POA: Insufficient documentation

## 2015-09-14 DIAGNOSIS — M545 Low back pain: Secondary | ICD-10-CM | POA: Diagnosis present

## 2015-09-14 DIAGNOSIS — S39012A Strain of muscle, fascia and tendon of lower back, initial encounter: Secondary | ICD-10-CM | POA: Insufficient documentation

## 2015-09-14 DIAGNOSIS — Y92410 Unspecified street and highway as the place of occurrence of the external cause: Secondary | ICD-10-CM | POA: Diagnosis not present

## 2015-09-14 DIAGNOSIS — Y939 Activity, unspecified: Secondary | ICD-10-CM | POA: Insufficient documentation

## 2015-09-14 LAB — POCT PREGNANCY, URINE: Preg Test, Ur: NEGATIVE

## 2015-09-14 MED ORDER — CYCLOBENZAPRINE HCL 10 MG PO TABS: 10.0000 mg | ORAL_TABLET | Freq: Three times a day (TID) | ORAL | 0 refills | Status: DC | PRN

## 2015-09-14 MED ORDER — ACETAMINOPHEN 325 MG PO TABS
650.0000 mg | ORAL_TABLET | Freq: Once | ORAL | Status: AC
  Administered 2015-09-14: 650 mg via ORAL
  Filled 2015-09-14: qty 2
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
## 2015-09-14 NOTE — ED Notes (Signed)
Discharge instructions reviewed with patient. Patient verbalized understanding. Patient ambulated to lobby without difficulty.   
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
## 2016-05-24 ENCOUNTER — Encounter: Payer: Self-pay | Admitting: Emergency Medicine

## 2016-05-24 DIAGNOSIS — Z79899 Other long term (current) drug therapy: Secondary | ICD-10-CM | POA: Insufficient documentation

## 2016-05-24 DIAGNOSIS — A6004 Herpesviral vulvovaginitis: Secondary | ICD-10-CM | POA: Insufficient documentation

## 2016-05-24 DIAGNOSIS — J45909 Unspecified asthma, uncomplicated: Secondary | ICD-10-CM | POA: Insufficient documentation

## 2016-05-24 DIAGNOSIS — A64 Unspecified sexually transmitted disease: Secondary | ICD-10-CM | POA: Insufficient documentation

## 2016-05-24 DIAGNOSIS — F17200 Nicotine dependence, unspecified, uncomplicated: Secondary | ICD-10-CM | POA: Insufficient documentation

## 2016-05-24 DIAGNOSIS — A5901 Trichomonal vulvovaginitis: Secondary | ICD-10-CM | POA: Insufficient documentation

## 2016-05-24 NOTE — ED Triage Notes (Signed)
Patient with complaint of vaginal pain times one week. Patient states that it burns with urination. Patient states that she has tried taking Ibuprofen and monistat with no improvement. Patient states that she also has a rash to her lower abdomen.

## 2016-05-24 NOTE — ED Notes (Signed)
Pt c/o vaginal pain for a week; says she has sores around her vagina that are painful;

## 2016-05-25 ENCOUNTER — Emergency Department
Admission: EM | Admit: 2016-05-25 | Discharge: 2016-05-25 | Disposition: A | Discharge status: Home / Self Care | Payer: Medicaid Other | Attending: Emergency Medicine | Admitting: Emergency Medicine

## 2016-05-25 DIAGNOSIS — A64 Unspecified sexually transmitted disease: Secondary | ICD-10-CM

## 2016-05-25 DIAGNOSIS — A599 Trichomoniasis, unspecified: Secondary | ICD-10-CM

## 2016-05-25 DIAGNOSIS — A6004 Herpesviral vulvovaginitis: Secondary | ICD-10-CM

## 2016-05-25 LAB — URINALYSIS, COMPLETE (UACMP) WITH MICROSCOPIC
Bilirubin Urine: NEGATIVE
Glucose, UA: NEGATIVE mg/dL
Ketones, ur: NEGATIVE mg/dL
Nitrite: NEGATIVE
Protein, ur: NEGATIVE mg/dL
Specific Gravity, Urine: 1.017 (ref 1.005–1.030)
pH: 6 (ref 5.0–8.0)

## 2016-05-25 LAB — WET PREP, GENITAL
Clue Cells Wet Prep HPF POC: NONE SEEN
Sperm: NONE SEEN
Yeast Wet Prep HPF POC: NONE SEEN

## 2016-05-25 LAB — CHLAMYDIA/NGC RT PCR (ARMC ONLY)
Chlamydia Tr: NOT DETECTED
N gonorrhoeae: NOT DETECTED

## 2016-05-25 LAB — POCT PREGNANCY, URINE: Preg Test, Ur: NEGATIVE

## 2016-05-25 MED ORDER — HYDROCODONE-ACETAMINOPHEN 5-325 MG PO TABS: 1.0000 | ORAL_TABLET | ORAL | 0 refills | Status: DC | PRN

## 2016-05-25 MED ORDER — AZITHROMYCIN 500 MG PO TABS
1000.0000 mg | ORAL_TABLET | ORAL | Status: AC
  Administered 2016-05-25: 1000 mg via ORAL
  Filled 2016-05-25: qty 2

## 2016-05-25 MED ORDER — DOCUSATE SODIUM 100 MG PO CAPS: ORAL_CAPSULE | ORAL | 0 refills | Status: DC

## 2016-05-25 MED ORDER — ONDANSETRON 4 MG PO TBDP
4.0000 mg | ORAL_TABLET | Freq: Once | ORAL | Status: AC
Start: 2016-05-25 — End: 2016-05-25
  Administered 2016-05-25: 4 mg via ORAL
  Filled 2016-05-25: qty 1

## 2016-05-25 MED ORDER — CEFTRIAXONE SODIUM 250 MG IJ SOLR
250.0000 mg | Freq: Once | INTRAMUSCULAR | Status: AC
  Administered 2016-05-25: 250 mg via INTRAMUSCULAR
  Filled 2016-05-25: qty 250

## 2016-05-25 MED ORDER — METRONIDAZOLE 500 MG PO TABS: 500.0000 mg | ORAL_TABLET | Freq: Once | ORAL | Status: DC

## 2016-05-25 MED ORDER — METRONIDAZOLE 500 MG PO TABS: 500.0000 mg | ORAL_TABLET | Freq: Two times a day (BID) | ORAL | 0 refills | Status: AC

## 2016-05-25 MED ORDER — ACYCLOVIR 400 MG PO TABS: 400.0000 mg | ORAL_TABLET | Freq: Three times a day (TID) | ORAL | 0 refills | Status: AC

## 2016-05-25 NOTE — ED Notes (Signed)
Pt set up for pelvic exam. md notified.

## 2016-05-25 NOTE — ED Notes (Signed)
Pt settled into room.  Additional warm blanket and pillow given and temperature in room increased per request of pt.  Pt confirmed triage notes that she has had sharp vaginal pain radiating up to lower abdomen. Initially was intermittent, but now is constant.  Has a burning pain with urination as well.  Has attempted to control pain at home with Tylenol as she is allergic to ibuprofen.  Has also used monistat cream and diluted H2O2 douche.  UA clean catch given.

## 2016-05-25 NOTE — Discharge Instructions (Signed)
You have been seen today in the Emergency Department (ED) for pelvic pain and discharge.  Your workup today shows that you had one or more sexually transmitted infections (STIs).  You have been treated with a one time dose medication for gonorrhea and chlamydia, although the test results were not back yet.  However your results WERE positive for trichomoniasis and herpes.  Please read through the included information about both conditions.  Please have your partner tested for STD's and do not resume sexual activity until you and your partner have confirmed negative test results or have both been treated.  Take the full course of both medications provided.  Take Norco as prescribed for severe pain. Do not drink alcohol, drive or participate in any other potentially dangerous activities while taking this medication as it may make you sleepy. Do not take this medication with any other sedating medications, either prescription or over-the-counter. If you were prescribed Percocet or Vicodin, do not take these with acetaminophen (Tylenol) as it is already contained within these medications.   This medication is an opiate (or narcotic) pain medication and can be habit forming.  Use it as little as possible to achieve adequate pain control.  Do not use or use it with extreme caution if you have a history of opiate abuse or dependence.  If you are on a pain contract with your primary care doctor or a pain specialist, be sure to let them know you were prescribed this medication today from the [facility] Emergency Department.  This medication is intended for your use only - do not give any to anyone else and keep it in a secure place where nobody else, especially children, have access to it.  It will also cause or worsen constipation, so you may want to consider taking an over-the-counter stool softener while you are taking this medication.  Please follow up with your doctor as soon as possible regarding today?s  ED visit and your symptoms.   Return to the ED if your pain worsens, you develop a fever, or for any other symptoms that concern you.
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
## 2016-05-25 NOTE — ED Notes (Signed)
Pt states she cannot urinate at this time. Pt states she will continue to try to provide sample.

## 2016-05-26 LAB — URINE CULTURE: Special Requests: NORMAL

## 2016-05-27 LAB — HSV CULTURE AND TYPING

## 2016-05-30 ENCOUNTER — Telehealth: Payer: Self-pay | Admitting: Emergency Medicine

## 2016-05-30 NOTE — Telephone Encounter (Signed)
Called patient to inform of hsv result.  Voicemail announced a different name, so I did not leave a message.  I will send letter asking her to call me.

## 2016-06-02 NOTE — Telephone Encounter (Signed)
4/23--patient called me back .  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  I discussed pcp options for her including [facility] and [facility].

## 2016-07-09 ENCOUNTER — Emergency Department: Payer: Self-pay

## 2016-07-09 ENCOUNTER — Emergency Department
Admission: EM | Admit: 2016-07-09 | Discharge: 2016-07-09 | Disposition: A | Discharge status: Home / Self Care | Payer: Self-pay | Attending: Emergency Medicine | Admitting: Emergency Medicine

## 2016-07-09 DIAGNOSIS — W010XXA Fall on same level from slipping, tripping and stumbling without subsequent striking against object, initial encounter: Secondary | ICD-10-CM | POA: Insufficient documentation

## 2016-07-09 DIAGNOSIS — F17200 Nicotine dependence, unspecified, uncomplicated: Secondary | ICD-10-CM | POA: Insufficient documentation

## 2016-07-09 DIAGNOSIS — M545 Low back pain, unspecified: Secondary | ICD-10-CM

## 2016-07-09 DIAGNOSIS — Y939 Activity, unspecified: Secondary | ICD-10-CM | POA: Insufficient documentation

## 2016-07-09 DIAGNOSIS — Y999 Unspecified external cause status: Secondary | ICD-10-CM | POA: Insufficient documentation

## 2016-07-09 DIAGNOSIS — Y929 Unspecified place or not applicable: Secondary | ICD-10-CM | POA: Insufficient documentation

## 2016-07-09 DIAGNOSIS — J45909 Unspecified asthma, uncomplicated: Secondary | ICD-10-CM | POA: Insufficient documentation

## 2016-07-09 LAB — POCT PREGNANCY, URINE: Preg Test, Ur: NEGATIVE

## 2016-07-09 MED ORDER — CYCLOBENZAPRINE HCL 10 MG PO TABS: 10.0000 mg | ORAL_TABLET | Freq: Three times a day (TID) | ORAL | 0 refills | Status: DC | PRN

## 2016-07-09 MED ORDER — NAPROXEN 500 MG PO TABS: 500.0000 mg | ORAL_TABLET | Freq: Two times a day (BID) | ORAL | 0 refills | Status: DC
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
## 2016-07-09 NOTE — ED Notes (Signed)
POC was performed and documented in wrong chart as negative before X-ray was performed. It has now been corrected and put in this patient chart. X-ray was notified of negative POC before completing the x-ray.

## 2016-07-09 NOTE — ED Notes (Signed)
Pt given warm blanket.
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
## 2016-07-09 NOTE — ED Notes (Signed)
See triage note  States she was trying to get into her [employer] this am  [place] backwards onto ground  Having pain to lower back and states she had a sharp which shot up into neck  Ambulates well to treatment room

## 2017-05-28 ENCOUNTER — Emergency Department
Admission: EM | Admit: 2017-05-28 | Discharge: 2017-05-28 | Disposition: A | Discharge status: Home / Self Care | Payer: Medicaid Other | Attending: Emergency Medicine | Admitting: Emergency Medicine

## 2017-05-28 ENCOUNTER — Other Ambulatory Visit: Payer: Self-pay

## 2017-05-28 DIAGNOSIS — F17210 Nicotine dependence, cigarettes, uncomplicated: Secondary | ICD-10-CM | POA: Insufficient documentation

## 2017-05-28 DIAGNOSIS — A6004 Herpesviral vulvovaginitis: Secondary | ICD-10-CM | POA: Insufficient documentation

## 2017-05-28 DIAGNOSIS — J45909 Unspecified asthma, uncomplicated: Secondary | ICD-10-CM | POA: Diagnosis not present

## 2017-05-28 DIAGNOSIS — L089 Local infection of the skin and subcutaneous tissue, unspecified: Secondary | ICD-10-CM | POA: Diagnosis present

## 2017-05-28 DIAGNOSIS — Z79899 Other long term (current) drug therapy: Secondary | ICD-10-CM | POA: Insufficient documentation

## 2017-05-28 DIAGNOSIS — A6000 Herpesviral infection of urogenital system, unspecified: Secondary | ICD-10-CM

## 2017-05-28 MED ORDER — ACYCLOVIR 400 MG PO TABS
400.0000 mg | ORAL_TABLET | Freq: Three times a day (TID) | ORAL | 0 refills | Status: DC
Start: 2017-05-28 — End: 2019-02-23

## 2017-05-28 MED ORDER — TRAMADOL HCL 50 MG PO TABS: 50.0000 mg | ORAL_TABLET | Freq: Four times a day (QID) | ORAL | 0 refills | Status: DC | PRN
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
## 2017-05-28 NOTE — ED Notes (Signed)
Pt states that skin between rectum and vagina is irriated and felt like hemorrhoids for the past couple days. Pt states that nurse that is a friend thought it was worse and told her to go to the ED. Pt states that pain so bad that she hasnt been able to sleep. Has had N/V the last couple of days, but none at the present time. AOx4.

## 2017-05-28 NOTE — ED Triage Notes (Signed)
Pt c/o redness and swelling with pain to the area between the vagina and rectal for the past 6 days and thought it was a hemorrhoid until she had a friend check it.. Denies fever.Marland Kitchen

## 2017-05-28 NOTE — Discharge Instructions (Signed)
Follow-up with your primary care doctor at [facility] if any continued problems.  You may also go to the [facility] if any further testing for sexually transmitted disease is desired.  Begin taking Zovirax 400 mg 3 times daily for the next 10 days.  Tramadol as needed for pain.  Do not drive or operate machinery while taking the tramadol as it could cause drowsiness.

## 2017-06-16 ENCOUNTER — Encounter: Payer: Self-pay | Admitting: Emergency Medicine

## 2017-06-16 ENCOUNTER — Other Ambulatory Visit: Payer: Self-pay

## 2017-06-16 DIAGNOSIS — Z79899 Other long term (current) drug therapy: Secondary | ICD-10-CM | POA: Insufficient documentation

## 2017-06-16 DIAGNOSIS — F319 Bipolar disorder, unspecified: Secondary | ICD-10-CM | POA: Diagnosis not present

## 2017-06-16 DIAGNOSIS — W108XXA Fall (on) (from) other stairs and steps, initial encounter: Secondary | ICD-10-CM | POA: Insufficient documentation

## 2017-06-16 DIAGNOSIS — Y929 Unspecified place or not applicable: Secondary | ICD-10-CM | POA: Diagnosis not present

## 2017-06-16 DIAGNOSIS — F17200 Nicotine dependence, unspecified, uncomplicated: Secondary | ICD-10-CM | POA: Insufficient documentation

## 2017-06-16 DIAGNOSIS — Y9389 Activity, other specified: Secondary | ICD-10-CM | POA: Insufficient documentation

## 2017-06-16 DIAGNOSIS — S93492A Sprain of other ligament of left ankle, initial encounter: Secondary | ICD-10-CM | POA: Diagnosis not present

## 2017-06-16 DIAGNOSIS — Y998 Other external cause status: Secondary | ICD-10-CM | POA: Diagnosis not present

## 2017-06-16 DIAGNOSIS — S92255A Nondisplaced fracture of navicular [scaphoid] of left foot, initial encounter for closed fracture: Secondary | ICD-10-CM | POA: Diagnosis not present

## 2017-06-16 DIAGNOSIS — J45909 Unspecified asthma, uncomplicated: Secondary | ICD-10-CM | POA: Insufficient documentation

## 2017-06-16 DIAGNOSIS — Z9049 Acquired absence of other specified parts of digestive tract: Secondary | ICD-10-CM | POA: Insufficient documentation

## 2017-06-16 DIAGNOSIS — S99912A Unspecified injury of left ankle, initial encounter: Secondary | ICD-10-CM | POA: Diagnosis present

## 2017-06-16 NOTE — ED Triage Notes (Signed)
Pt to triage via w/c with no distress noted; reports going after her son down a hill and felt left ankle "snap"

## 2017-06-17 ENCOUNTER — Emergency Department
Admission: EM | Admit: 2017-06-17 | Discharge: 2017-06-17 | Disposition: A | Discharge status: Home / Self Care | Payer: Medicaid Other | Attending: Emergency Medicine | Admitting: Emergency Medicine

## 2017-06-17 ENCOUNTER — Emergency Department: Payer: Medicaid Other

## 2017-06-17 DIAGNOSIS — S92255A Nondisplaced fracture of navicular [scaphoid] of left foot, initial encounter for closed fracture: Secondary | ICD-10-CM

## 2017-06-17 DIAGNOSIS — S93492A Sprain of other ligament of left ankle, initial encounter: Secondary | ICD-10-CM

## 2017-06-17 MED ORDER — HYDROCODONE-ACETAMINOPHEN 5-325 MG PO TABS: 1.0000 | ORAL_TABLET | Freq: Four times a day (QID) | ORAL | 0 refills | Status: DC | PRN

## 2017-06-17 MED ORDER — HYDROCODONE-ACETAMINOPHEN 5-325 MG PO TABS
1.0000 | ORAL_TABLET | Freq: Once | ORAL | Status: AC
  Administered 2017-06-17: 1 via ORAL
  Filled 2017-06-17: qty 1

## 2017-06-17 NOTE — ED Notes (Addendum)
Pt states that she slipped and fell in some grass on yesterday and hurt her left ankle. Pt alert and oriented x 4.

## 2017-06-17 NOTE — Discharge Instructions (Signed)
Please follow-up with the orthopedic surgeon within 1 week for reevaluation and do not put any weight on your left ankle until you see the specialist.  Return to the emergency department sooner for any concerns.  It was a pleasure to take care of you today, and thank you for coming to our emergency department.  If you have any questions or concerns before leaving please ask the nurse to grab me and I'm more than happy to go through your aftercare instructions again.  If you were prescribed any opioid pain medication today such as Norco, Vicodin, Percocet, morphine, hydrocodone, or oxycodone please make sure you do not drive when you are taking this medication as it can alter your ability to drive safely.  If you have any concerns once you are home that you are not improving or are in fact getting worse before you can make it to your follow-up appointment, please do not hesitate to call [phone] and come back for further evaluation.  Darel Hong, MD  Results for orders placed or performed during the hospital encounter of 07/09/16  Pregnancy, urine POC  Result Value Ref Range   Preg Test, Ur NEGATIVE NEGATIVE   Dg Ankle Complete Left  Result Date: 06/17/2017 CLINICAL DATA:  Patient ran down hill after sun and felt her ankle snap. EXAM: LEFT ANKLE COMPLETE - 3+ VIEW COMPARISON:  None. FINDINGS: A nondisplaced fracture at the base of what appears to be a dorsal spur off the navicular is noted on the lateral view. Associated soft tissue swelling is seen. No joint dislocation is identified. Stigmata of old remote avulsion injury off the tip of the lateral malleolus is also noted with well corticated ossifications present. IMPRESSION: Acute appearing fracture involving what appears to be a dorsal spur off the navicular with overlying soft tissue swelling. Electronically Signed   By: Ashley Royalty M.D.   On: 06/17/2017 00:57
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
## 2018-05-31 ENCOUNTER — Telehealth: Payer: Self-pay

## 2018-05-31 NOTE — Telephone Encounter (Signed)
Pt calling for appt for preg test; has to have blood test done.  [phone]  Mailbox full 05/24/18; mailbox full 05/31/18.  Msg closed.

## 2018-06-16 ENCOUNTER — Encounter: Payer: Self-pay | Admitting: Maternal Newborn

## 2018-08-16 ENCOUNTER — Emergency Department: Payer: Medicaid Other

## 2018-08-16 ENCOUNTER — Other Ambulatory Visit: Payer: Self-pay

## 2018-08-16 ENCOUNTER — Emergency Department
Admission: EM | Admit: 2018-08-16 | Discharge: 2018-08-16 | Disposition: A | Discharge status: Home / Self Care | Payer: Medicaid Other | Attending: Emergency Medicine | Admitting: Emergency Medicine

## 2018-08-16 ENCOUNTER — Encounter: Payer: Self-pay | Admitting: Emergency Medicine

## 2018-08-16 DIAGNOSIS — F17210 Nicotine dependence, cigarettes, uncomplicated: Secondary | ICD-10-CM | POA: Insufficient documentation

## 2018-08-16 DIAGNOSIS — F319 Bipolar disorder, unspecified: Secondary | ICD-10-CM | POA: Diagnosis not present

## 2018-08-16 DIAGNOSIS — N83202 Unspecified ovarian cyst, left side: Secondary | ICD-10-CM | POA: Diagnosis not present

## 2018-08-16 DIAGNOSIS — R1030 Lower abdominal pain, unspecified: Secondary | ICD-10-CM | POA: Diagnosis not present

## 2018-08-16 DIAGNOSIS — N939 Abnormal uterine and vaginal bleeding, unspecified: Secondary | ICD-10-CM | POA: Diagnosis present

## 2018-08-16 LAB — COMPREHENSIVE METABOLIC PANEL
ALT: 24 U/L (ref 0–44)
AST: 20 U/L (ref 15–41)
Albumin: 4.1 g/dL (ref 3.5–5.0)
Alkaline Phosphatase: 64 U/L (ref 38–126)
Anion gap: 7 (ref 5–15)
BUN: 7 mg/dL (ref 6–20)
CO2: 25 mmol/L (ref 22–32)
Calcium: 8.7 mg/dL — ABNORMAL LOW (ref 8.9–10.3)
Chloride: 110 mmol/L (ref 98–111)
Creatinine, Ser: 0.74 mg/dL (ref 0.44–1.00)
GFR calc Af Amer: >60 mL/min (ref >60)
GFR calc non Af Amer: >60 mL/min (ref >60)
Glucose, Bld: 80 mg/dL (ref 70–99)
Potassium: 4 mmol/L (ref 3.5–5.1)
Sodium: 142 mmol/L (ref 135–145)
Total Bilirubin: 0.6 mg/dL (ref 0.3–1.2)
Total Protein: 7.5 g/dL (ref 6.5–8.1)

## 2018-08-16 LAB — HCG, QUANTITATIVE, PREGNANCY: hCG, Beta Chain, Quant, S: <1 m[IU]/mL (ref <5)

## 2018-08-16 LAB — CBC
HCT: 42.4 % (ref 36.0–46.0)
Hemoglobin: 14.1 g/dL (ref 12.0–15.0)
MCH: 32.9 pg (ref 26.0–34.0)
MCHC: 33.3 g/dL (ref 30.0–36.0)
MCV: 98.8 fL (ref 80.0–100.0)
Platelets: 273 10*3/uL (ref 150–400)
RBC: 4.29 MIL/uL (ref 3.87–5.11)
RDW: 12.2 % (ref 11.5–15.5)
WBC: 8.2 10*3/uL (ref 4.0–10.5)
nRBC: 0 % (ref 0.0–0.2)

## 2018-08-16 MED ORDER — TRAMADOL HCL 50 MG PO TABS: 50.0000 mg | ORAL_TABLET | Freq: Four times a day (QID) | ORAL | 0 refills | Status: DC | PRN
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
## 2018-08-16 NOTE — ED Triage Notes (Signed)
Pt reports took a pregnancy test a month ago and it was positive. Pt states has not been able to start prenatal care yet but reports this am at work she passed a large clot and has been bleeding and cramping since then.

## 2018-08-16 NOTE — ED Notes (Signed)
Patient taken to ultrasound.

## 2018-08-16 NOTE — ED Notes (Addendum)
Resumed care from sonja rn  Pt alert and on cell phone.  Pt waiting on u/s results.

## 2018-08-16 NOTE — ED Provider Notes (Signed)
IMPRESSION: 6.6 cm left ovarian/paraovarian cyst, predominantly simple, likely physiologic. Consider follow-up pelvic ultrasound in 6-10 weeks, as clinically warranted.  No evidence of ovarian torsion.  Ultrasound findings as dictated above.  She was informed of her ovarian cyst.  She has been encouraged to follow-up with her GYN doctor.   Earleen Newport, MD 08/16/18 [phone]

## 2018-08-16 NOTE — ED Notes (Signed)
Report given to AMy C

## 2018-08-23 ENCOUNTER — Ambulatory Visit: Payer: Medicaid Other | Admitting: Obstetrics & Gynecology

## 2018-08-23 ENCOUNTER — Telehealth: Payer: Self-pay | Admitting: Obstetrics & Gynecology

## 2018-08-23 ENCOUNTER — Other Ambulatory Visit: Payer: Self-pay

## 2018-08-23 ENCOUNTER — Encounter: Payer: Self-pay | Admitting: Obstetrics & Gynecology

## 2018-08-23 VITALS — BP 120/80 | Ht 65.0 in | Wt 249.0 lb

## 2018-08-23 DIAGNOSIS — N83202 Unspecified ovarian cyst, left side: Secondary | ICD-10-CM | POA: Diagnosis not present

## 2018-08-23 DIAGNOSIS — R1032 Left lower quadrant pain: Secondary | ICD-10-CM

## 2018-08-23 MED ORDER — OXYCODONE-ACETAMINOPHEN 5-325 MG PO TABS: 1.0000 | ORAL_TABLET | ORAL | 0 refills | Status: DC | PRN

## 2018-08-23 NOTE — H&P (View-Only) (Signed)
HPI: Patient is a 28 y.o. G2P1001 who LMP was Patient's last menstrual period was 05/25/2018 (approximate)., presents today for a problem visit.  She complains of recent findings of Left ovarion cyst by Ultrasound - Pelvic Vaginal.  Pt has had symptoms of pain, this has been LLQ severe at times radiating to back and assoc w mild nausea, no modifiers although activity makes it worse, no h/o cysts or other context.  Reg cycles.  Prior 2 NSVD.  Had IUD until recent expulsion.  No current hormonal contraception.  Just had period and has neg preg test last week..  Previous evaluation: emergency room visit on 08/16/2018. Prior Diagnosis: none. Previous Treatment: none.  PMHx: She  has a past medical history of Asthma, Bipolar 1 disorder ([place]), Depression, and Headache(784.0). Also,  has a past surgical history that includes No past surgeries and Cholecystectomy., family history includes Cancer in her maternal aunt, maternal grandmother, and mother.,  reports that she has been smoking. She has never used smokeless tobacco. She reports current alcohol use. She reports that she does not use drugs.  She has a current medication list which includes the following prescription(s): citalopram, tramadol, acyclovir, albuterol, docusate sodium, hydrocodone-acetaminophen, prenatal multivitamin, and tramadol. Also, is allergic to codeine and ibuprofen.  Review of Systems  Constitutional: Negative for chills, fever and malaise/fatigue.  HENT: Negative for congestion, sinus pain and sore throat.   Eyes: Negative for blurred vision and pain.  Respiratory: Negative for cough and wheezing.   Cardiovascular: Negative for chest pain and leg swelling.  Gastrointestinal: Negative for abdominal pain, constipation, diarrhea, heartburn, nausea and vomiting.  Genitourinary: Negative for dysuria, frequency, hematuria and urgency.  Musculoskeletal: Negative for back pain, joint pain, myalgias and neck pain.  Skin: Negative for  itching and rash.  Neurological: Negative for dizziness, tremors and weakness.  Endo/Heme/Allergies: Does not bruise/bleed easily.  Psychiatric/Behavioral: Negative for depression. The patient is not nervous/anxious and does not have insomnia.     Objective: BP 120/80   Ht 5\' 5"  (1.651 m)   Wt 249 lb (112.9 kg)   LMP 05/25/2018 (Approximate)   BMI 41.44 kg/m  Physical Exam Constitutional:      General: She is not in acute distress.    Appearance: She is well-developed.  HENT:     Head: Normocephalic and atraumatic. No laceration.     Right Ear: Hearing normal.     Left Ear: Hearing normal.     Mouth/Throat:     Pharynx: Uvula midline.  Eyes:     Pupils: Pupils are equal, round, and reactive to light.  Neck:     Musculoskeletal: Normal range of motion and neck supple.     Thyroid: No thyromegaly.  Cardiovascular:     Rate and Rhythm: Normal rate and regular rhythm.     Heart sounds: No murmur. No friction rub. No gallop.   Pulmonary:     Effort: Pulmonary effort is normal. No respiratory distress.     Breath sounds: Normal breath sounds. No wheezing.  Chest:     Breasts:        Right: No mass, skin change or tenderness.        Left: No mass, skin change or tenderness.  Abdominal:     General: Bowel sounds are normal. There is no distension.     Palpations: Abdomen is soft.     Tenderness: There is abdominal tenderness in the left lower quadrant. There is no rebound. Negative signs include Murphy's sign, Rovsing's sign  and McBurney's sign.  Musculoskeletal: Normal range of motion.  Neurological:     Mental Status: She is alert and oriented to person, place, and time.     Cranial Nerves: No cranial nerve deficit.  Skin:    General: Skin is warm and dry.  Psychiatric:        Judgment: Judgment normal.  Vitals signs reviewed.     ASSESSMENT/PLAN:     ICD-10-CM   1. Cyst of left ovary  N83.202   2. LLQ abdominal pain  R10.32   Options discussed, exp mgt and repeat  f/u US vs laparoscopy with cystectomy Sch for surgery due to the pain   Barnett Applebaum, MD, [name], [facility] 08/23/2018  3:57 PM

## 2018-08-23 NOTE — Telephone Encounter (Signed)
-----   Message from Gae Dry, MD sent at 08/23/2018  3:53 PM EDT ----- Regarding: Surgery Surgery Booking Request Patient Full Name:  Ashley Moyer  MRN: [id number]  DOB: March 22, 1990  Surgeon: Hoyt Koch, MD  Requested Surgery Date and Time: 08/31/2018 Primary Diagnosis AND Code: N83.202 Left Ovarian Cyst Secondary Diagnosis and Code:  Surgical Procedure: Laparoscopy ovarian cystectomy L&D Notification: No Admission Status: same day surgery Length of Surgery: 50 min Special Case Needs: no H&P: done (date) Phone Interview???: yes Interpreter: Language:  Medical Clearance: no Special Scheduling Instructions: no Acuity: P2

## 2018-08-23 NOTE — Progress Notes (Signed)
HPI: Patient is a 28 y.o. G2P1001 who LMP was Patient's last menstrual period was 05/25/2018 (approximate)., presents today for a problem visit.  She complains of recent findings of Left ovarion cyst by Ultrasound - Pelvic Vaginal.  Pt has had symptoms of pain, this has been LLQ severe at times radiating to back and assoc w mild nausea, no modifiers although activity makes it worse, no h/o cysts or other context.  Reg cycles.  Prior 2 NSVD.  Had IUD until recent expulsion.  No current hormonal contraception.  Just had period and has neg preg test last week..  Previous evaluation: emergency room visit on 08/16/2018. Prior Diagnosis: none. Previous Treatment: none.  PMHx: She  has a past medical history of Asthma, Bipolar 1 disorder ([place]), Depression, and Headache(784.0). Also,  has a past surgical history that includes No past surgeries and Cholecystectomy., family history includes Cancer in her maternal aunt, maternal grandmother, and mother.,  reports that she has been smoking. She has never used smokeless tobacco. She reports current alcohol use. She reports that she does not use drugs.  She has a current medication list which includes the following prescription(s): citalopram, tramadol, acyclovir, albuterol, docusate sodium, hydrocodone-acetaminophen, prenatal multivitamin, and tramadol. Also, is allergic to codeine and ibuprofen.  Review of Systems  Constitutional: Negative for chills, fever and malaise/fatigue.  HENT: Negative for congestion, sinus pain and sore throat.   Eyes: Negative for blurred vision and pain.  Respiratory: Negative for cough and wheezing.   Cardiovascular: Negative for chest pain and leg swelling.  Gastrointestinal: Negative for abdominal pain, constipation, diarrhea, heartburn, nausea and vomiting.  Genitourinary: Negative for dysuria, frequency, hematuria and urgency.  Musculoskeletal: Negative for back pain, joint pain, myalgias and neck pain.  Skin: Negative for  itching and rash.  Neurological: Negative for dizziness, tremors and weakness.  Endo/Heme/Allergies: Does not bruise/bleed easily.  Psychiatric/Behavioral: Negative for depression. The patient is not nervous/anxious and does not have insomnia.     Objective: BP 120/80   Ht 5\' 5"  (1.651 m)   Wt 249 lb (112.9 kg)   LMP 05/25/2018 (Approximate)   BMI 41.44 kg/m  Physical Exam Constitutional:      General: She is not in acute distress.    Appearance: She is well-developed.  HENT:     Head: Normocephalic and atraumatic. No laceration.     Right Ear: Hearing normal.     Left Ear: Hearing normal.     Mouth/Throat:     Pharynx: Uvula midline.  Eyes:     Pupils: Pupils are equal, round, and reactive to light.  Neck:     Musculoskeletal: Normal range of motion and neck supple.     Thyroid: No thyromegaly.  Cardiovascular:     Rate and Rhythm: Normal rate and regular rhythm.     Heart sounds: No murmur. No friction rub. No gallop.   Pulmonary:     Effort: Pulmonary effort is normal. No respiratory distress.     Breath sounds: Normal breath sounds. No wheezing.  Chest:     Breasts:        Right: No mass, skin change or tenderness.        Left: No mass, skin change or tenderness.  Abdominal:     General: Bowel sounds are normal. There is no distension.     Palpations: Abdomen is soft.     Tenderness: There is abdominal tenderness in the left lower quadrant. There is no rebound. Negative signs include Murphy's sign, Rovsing's sign  and McBurney's sign.  Musculoskeletal: Normal range of motion.  Neurological:     Mental Status: She is alert and oriented to person, place, and time.     Cranial Nerves: No cranial nerve deficit.  Skin:    General: Skin is warm and dry.  Psychiatric:        Judgment: Judgment normal.  Vitals signs reviewed.     ASSESSMENT/PLAN:     ICD-10-CM   1. Cyst of left ovary  N83.202   2. LLQ abdominal pain  R10.32   Options discussed, exp mgt and repeat  f/u US vs laparoscopy with cystectomy Sch for surgery due to the pain   Barnett Applebaum, MD, [name], [facility] 08/23/2018  3:57 PM

## 2018-08-23 NOTE — Telephone Encounter (Signed)
Lmtrc

## 2018-08-23 NOTE — Patient Instructions (Signed)
Ovarian Cystectomy    Ovarian cystectomy is a procedure that is done to remove a fluid-filled sac (cyst) on an ovary. The ovaries are small organs that produce eggs in women. Various types of cysts can form on the ovaries. Most are not cancerous. This procedure may be done for cysts that are large, cause symptoms, or do not go away on their own. It may also be done for a cyst that is cancerous or might be cancerous.  This surgery can be done using a laparoscopic technique or an open abdominal technique. The laparoscopic technique involves smaller incisions and results in a faster recovery time. The technique used will depend on your age, the type of cyst that you have, and whether the cyst is cancerous. The laparoscopic technique is not used for a cancerous cyst.  Tell a health care provider about:  · Any allergies you have.  · All medicines you are taking, including vitamins, herbs, eye drops, creams, and over-the-counter medicines.  · Any problems you or family members have had with the use of anesthetic medicines.  · Any blood disorders you have.  · Any surgeries you have had.  · Any medical conditions you have.  · Whether you are pregnant or may be pregnant.  What are the risks?  Generally, this is a safe procedure. However, problems may occur, including:  · Excessive bleeding.  · Infection.  · Damage to other structures or organs.  · Blood clots.  · Inability get pregnant (infertility).  What happens before the procedure?  Staying hydrated  Follow instructions from your health care provider about hydration, which may include:  · Up to 2 hours before the procedure - you may continue to drink clear liquids, such as water, clear fruit juice, black coffee, and plain tea.  Eating and drinking restrictions  Follow instructions from your health care provider about eating and drinking, which may include:  · 8 hours before the procedure - stop eating and drinking everything except clear liquids.  · 6 hours before the  procedure - stop eating light meals or foods, such as toast or cereal.  · 6 hours before the procedure - stop drinking milk or drinks that contain milk.  · 2 hours before the procedure - stop drinking clear liquids.  Medicines  · Ask your health care provider about:  ? Changing or stopping your regular medicines. This is especially important if you are taking diabetes medicines or blood thinners.  ? Taking medicines such as aspirin and ibuprofen. These medicines can thin your blood. Do not take these medicines before your procedure if your health care provider instructs you not to.  · You may be given antibiotic medicine to help prevent infection.  General instructions  · Do not use any products that contain nicotine or tobacco, such as cigarettes and e-cigarettes, for 2 weeks before the procedure. If you need help quitting, ask your health care provider.  · Ask your health care provider how your surgical site will be marked or identified.  · Plan to have someone take you home from the hospital or clinic.  · Plan to have someone help with household activities for 1-2 weeks after the procedure.  · You may be asked to shower with a germ-killing soap.  · Let your health care provider know if you develop a cold or any infection before your surgery.  What happens during the procedure?  · To reduce your risk of infection:  ? Your health care team will   following: ? A medicine to help you relax (sedative). ? A medicine to make you fall asleep (general anesthetic).  Small monitors will be attached to your body. They will be used to check your heart, blood pressure, and oxygen level.  A breathing tube will be placed into your lungs during the procedure.  Your surgeon will use one of the following methods to perform  the surgery: Laparoscopic technique  Several small incisions will be made in your abdomen. These will typically be about 1 to 2 cm long.  Your abdomen will be filled with carbon dioxide gas to make it expand. This will give the surgeon more room to operate. It will also make your organs easier to see.  A thin, lighted tube with a camera (laparoscope) will be put through one of the small incisions. The laparoscope will send a picture to a TV screen in the operating room. This will give the surgeon a good view of your organs.  Hollow tubes will be put through the other small incisions in your abdomen. The tools needed for the procedure will be put through these tubes.  The ovary with the cyst will be identified, and the cyst will be removed. It will then be sent to the lab for testing. If the cyst has cancer cells, both ovaries may need to be removed during a different surgery.  Tools will be removed. The incisions will then be closed with stitches or skin glue. Bandages (dressings) may be applied. Open abdominal technique  A single, large incision will be made along your bikini line or in the middle of your lower abdomen.  The ovary with the cyst will be identified, and the cyst will be removed. It will then be sent to the lab for testing. If the cyst has cancer cells, both ovaries may need to be removed during a different surgery.  The incision will then be closed with stitches or staples.  Bandages (dressings) may be applied. The procedure may vary among health care providers and hospitals. What happens after the procedure?  Your blood pressure, heart rate, breathing rate, and blood oxygen level will be monitored until the medicines you were given have worn off.  Your IV access will be removed after you are able to eat and drink well.  You may be given medicine for pain or to help you sleep.  You may be given an antibiotic medicine.  Do not drive for 24 hours if you were given a  sedative. Summary  Ovarian cystectomy is a procedure that is done to remove a cyst on an ovary.  This procedure may be done for cysts that are large, cause symptoms, or do not go away on their own. It may also be done for a cyst that is cancerous or might be cancerous.  Follow instructions from your health care provider about eating and drinking before the procedure.  After the cyst is removed, it will be sent to the lab for testing. This information is not intended to replace advice given to you by your health care provider. Make sure you discuss any questions you have with your health care provider. Document Released: 11/24/2006 Document Revised: 01/09/2017 Document Reviewed: 03/18/2016 Elsevier Patient Education  2020 [employer].

## 2018-08-23 NOTE — Progress Notes (Signed)
PRE-OPERATIVE HISTORY AND PHYSICAL EXAM  HPI:  Ashley Moyer is a 28 y.o. G2P1001 Patient's last menstrual period was 05/25/2018 (approximate).; she is being admitted for surgery related to adnexal mass and pelvic pain.  Pt has had symptoms of pain, this has been LLQ severe at times radiating to back and assoc w mild nausea, no modifiers although activity makes it worse, no h/o cysts or other context.  Reg cycles.  Prior 2 NSVD.  Had IUD until recent expulsion.  No current hormonal contraception.  Just had period and has neg preg test last week.  Ultrasound reveals 6.6cm left adnexal cystic mass.  PMHx: Past Medical History:  Diagnosis Date  . Asthma   . Bipolar 1 disorder ([place])   . Depression   . TFTDDUKG(254.2)    Past Surgical History:  Procedure Laterality Date  . CHOLECYSTECTOMY    . NO PAST SURGERIES     Family History  Problem Relation Age of Onset  . Cancer Mother   . Cancer Maternal Aunt   . Cancer Maternal Grandmother    Social History   Tobacco Use  . Smoking status: Current Some Day Smoker  . Smokeless tobacco: Never Used  Substance Use Topics  . Alcohol use: Yes    Comment: rarely  . Drug use: No    Current Outpatient Medications:  .  citalopram (CELEXA) 20 MG tablet, Take 20 mg by mouth daily., Disp: , Rfl:  .  traMADol (ULTRAM) 50 MG tablet, Take 1 tablet (50 mg total) by mouth every 6 (six) hours as needed., Disp: 20 tablet, Rfl: 0 .  acyclovir (ZOVIRAX) 400 MG tablet, Take 1 tablet (400 mg total) by mouth 3 (three) times daily. (Patient not taking: Reported on 08/16/2018), Disp: 30 tablet, Rfl: 0 .  albuterol (PROVENTIL HFA;VENTOLIN HFA) 108 (90 BASE) MCG/ACT inhaler, Inhale 2 puffs into the lungs every 6 (six) hours as needed for wheezing or shortness of breath., Disp: , Rfl:  .  docusate sodium (COLACE) 100 MG capsule, Take 1 tablet once or twice daily as needed for constipation while taking narcotic pain medicine (Patient not taking: Reported on  08/16/2018), Disp: 30 capsule, Rfl: 0 .  HYDROcodone-acetaminophen (NORCO) 5-325 MG tablet, Take 1 tablet by mouth every 6 (six) hours as needed for up to 15 doses for severe pain. (Patient not taking: Reported on 08/16/2018), Disp: 15 tablet, Rfl: 0 .  oxyCODONE-acetaminophen (PERCOCET) 5-325 MG tablet, Take 1 tablet by mouth every 4 (four) hours as needed for moderate pain or severe pain., Disp: 30 tablet, Rfl: 0 .  Prenatal Vit-Fe Fumarate-FA (PRENATAL MULTIVITAMIN) TABS tablet, Take 1 tablet by mouth daily at 12 noon., Disp: , Rfl:  .  traMADol (ULTRAM) 50 MG tablet, Take 1 tablet (50 mg total) by mouth every 6 (six) hours as needed. (Patient not taking: Reported on 08/23/2018), Disp: 20 tablet, Rfl: 0 Allergies: Codeine and Ibuprofen  Review of Systems  Constitutional: Negative for chills, fever and malaise/fatigue.  HENT: Negative for congestion, sinus pain and sore throat.   Eyes: Negative for blurred vision and pain.  Respiratory: Negative for cough and wheezing.   Cardiovascular: Negative for chest pain and leg swelling.  Gastrointestinal: Negative for abdominal pain, constipation, diarrhea, heartburn, nausea and vomiting.  Genitourinary: Negative for dysuria, frequency, hematuria and urgency.  Musculoskeletal: Negative for back pain, joint pain, myalgias and neck pain.  Skin: Negative for itching and rash.  Neurological: Negative for dizziness, tremors and weakness.  Endo/Heme/Allergies: Does not  bruise/bleed easily.  Psychiatric/Behavioral: Negative for depression. The patient is not nervous/anxious and does not have insomnia.     Objective: BP 120/80   Ht 5\' 5"  (1.651 m)   Wt 249 lb (112.9 kg)   LMP 05/25/2018 (Approximate)   BMI 41.44 kg/m   Filed Weights   08/23/18 1528  Weight: 249 lb (112.9 kg)   Physical Exam Constitutional:      General: She is not in acute distress.    Appearance: She is well-developed.  HENT:     Head: Normocephalic and atraumatic. No laceration.      Right Ear: Hearing normal.     Left Ear: Hearing normal.     Mouth/Throat:     Pharynx: Uvula midline.  Eyes:     Pupils: Pupils are equal, round, and reactive to light.  Neck:     Musculoskeletal: Normal range of motion and neck supple.     Thyroid: No thyromegaly.  Cardiovascular:     Rate and Rhythm: Normal rate and regular rhythm.     Heart sounds: No murmur. No friction rub. No gallop.   Pulmonary:     Effort: Pulmonary effort is normal. No respiratory distress.     Breath sounds: Normal breath sounds. No wheezing.  Chest:     Breasts:        Right: No mass, skin change or tenderness.        Left: No mass, skin change or tenderness.  Abdominal:     General: Bowel sounds are normal. There is no distension.     Palpations: Abdomen is soft.     Tenderness: There is abdominal tenderness in the left lower quadrant. There is no rebound.  Musculoskeletal: Normal range of motion.  Neurological:     Mental Status: She is alert and oriented to person, place, and time.     Cranial Nerves: No cranial nerve deficit.  Skin:    General: Skin is warm and dry.  Psychiatric:        Judgment: Judgment normal.  Vitals signs reviewed.     Assessment: 1. Cyst of left ovary   2. LLQ abdominal pain   Plan laparoscopy, left ovarian cystectomy  I have had a careful discussion with this patient about all the options available and the risk/benefits of each. I have fully informed this patient that surgery may subject her to a variety of discomforts and risks: She understands that most patients have surgery with little difficulty, but problems can happen ranging from minor to fatal. These include nausea, vomiting, pain, bleeding, infection, poor healing, hernia, or formation of adhesions. Unexpected reactions may occur from any drug or anesthetic given. Unintended injury may occur to other pelvic or abdominal structures such as Fallopian tubes, ovaries, bladder, ureter (tube from kidney to  bladder), or bowel. Nerves going from the pelvis to the legs may be injured. Any such injury may require immediate or later additional surgery to correct the problem. Excessive blood loss requiring transfusion is very unlikely but possible. Dangerous blood clots may form in the legs or lungs. Physical and sexual activity will be restricted in varying degrees for an indeterminate period of time but most often 2-6 weeks.  Finally, she understands that it is impossible to list every possible undesirable effect and that the condition for which surgery is done is not always cured or significantly improved, and in rare cases may be even worse.Ample time was given to answer all questions.  Barnett Applebaum, MD, [facility],  [facility] 08/23/2018  5:07 PM

## 2018-08-24 NOTE — Telephone Encounter (Signed)
Patient is aware of Pre-admit testing phone interview, and covid testing appointment. Patient needs covid appt changed from Thurs to Friday, due to work.

## 2018-08-24 NOTE — Telephone Encounter (Signed)
Lmtrc

## 2018-08-25 ENCOUNTER — Encounter
Admission: RE | Admit: 2018-08-25 | Discharge: 2018-08-25 | Disposition: A | Discharge status: Home / Self Care | Payer: Medicaid Other | Source: Ambulatory Visit | Attending: Obstetrics & Gynecology | Admitting: Obstetrics & Gynecology

## 2018-08-25 ENCOUNTER — Other Ambulatory Visit: Payer: Self-pay

## 2018-08-25 DIAGNOSIS — Z01812 Encounter for preprocedural laboratory examination: Secondary | ICD-10-CM | POA: Diagnosis present

## 2018-08-25 HISTORY — DX: Unspecified osteoarthritis, unspecified site: M19.90

## 2018-08-25 NOTE — Patient Instructions (Signed)
Your procedure is scheduled on: 08-31-18 TUESDAY Report to Same Day Surgery 2nd floor medical mall [facility] Entrance-take elevator on left to 2nd floor.  Check in with surgery information desk.) To find out your arrival time please call [phone] between 1PM - 3PM on 08-30-18 MONDAY  Remember: Instructions that are not followed completely may result in serious medical risk, up to and including death, or upon the discretion of your surgeon and anesthesiologist your surgery may need to be rescheduled.    _x___ 1. Do not eat food after midnight the night before your procedure. NO GUM OR CANDY AFTER MIDNIGHT. You may drink clear liquids up to 2 hours before you are scheduled to arrive at the hospital for your procedure.  Do not drink clear liquids within 2 hours of your scheduled arrival to the hospital.  Clear liquids include  --Water or Apple juice without pulp  --Clear carbohydrate beverage such as ClearFast or Gatorade  --Black Coffee or Clear Tea (No milk, no creamers, do not add anything to the coffee or Tea   ____Ensure clear carbohydrate drink on the way to the hospital for bariatric patients  ____Ensure clear carbohydrate drink 3 hours before surgery for Dr Dwyane Luo patients if physician instructed.    __x__ 2. No Alcohol for 24 hours before or after surgery.   __x__3. No Smoking or e-cigarettes for 24 prior to surgery.  Do not use any chewable tobacco products for at least 6 hour prior to surgery   ____  4. Bring all medications with you on the day of surgery if instructed.    __x__ 5. Notify your doctor if there is any change in your medical condition     (cold, fever, infections).    x___6. On the morning of surgery brush your teeth with toothpaste and water.  You may rinse your mouth with mouth wash if you wish.  Do not swallow any toothpaste or mouthwash.   Do not wear jewelry, make-up, hairpins, clips or nail polish.  Do not wear lotions, powders, or perfumes. You  may wear deodorant.  Do not shave 48 hours prior to surgery. Men may shave face and neck.  Do not bring valuables to the hospital.    [facility] is not responsible for any belongings or valuables.               Contacts, dentures or bridgework may not be worn into surgery.  Leave your suitcase in the car. After surgery it may be brought to your room.  For patients admitted to the hospital, discharge time is determined by your treatment team.  _  Patients discharged the day of surgery will not be allowed to drive home.  You will need someone to drive you home and stay with you the night of your procedure.    Please read over the following fact sheets that you were given:   [facility] Preparing for Surgery    _x___ TAKE THE FOLLOWING MEDICATION THE MORNING OF SURGERY WITH A SMALL SIP OF WATER. These include:  1. CELEXA (CITALOPRAM)  2.  3.  4.  5.  6.  ____Fleets enema or Magnesium Citrate as directed.   _x___ Use CHG Soap or sage wipes as directed on instruction sheet   ____ Use inhalers on the day of surgery and bring to hospital day of surgery  ____ Stop Metformin and Janumet 2 days prior to surgery.    ____ Take 1/2 of usual insulin dose the night before  surgery and none on the morning     surgery.   ____ Follow recommendations from Cardiologist, Pulmonologist or PCP regarding          stopping Aspirin, Coumadin, Plavix ,Eliquis, Effient, or Pradaxa, and Pletal.  X____Stop Anti-inflammatories such as Advil, Aleve, Ibuprofen, Motrin, Naproxen, Naprosyn, Goodies powders or aspirin products NOW-OK to take Tylenol/PERCOCET/TRAMADOL IF NEEDED   ____ Stop supplements until after surgery.    ____ Bring C-Pap to the hospital.

## 2018-08-26 ENCOUNTER — Other Ambulatory Visit: Payer: Medicaid Other

## 2018-08-27 ENCOUNTER — Other Ambulatory Visit
Admission: RE | Admit: 2018-08-27 | Discharge: 2018-08-27 | Disposition: A | Discharge status: Home / Self Care | Payer: Medicaid Other | Source: Ambulatory Visit | Attending: Obstetrics & Gynecology | Admitting: Obstetrics & Gynecology

## 2018-08-27 ENCOUNTER — Other Ambulatory Visit: Payer: Self-pay

## 2018-08-27 DIAGNOSIS — Z1159 Encounter for screening for other viral diseases: Secondary | ICD-10-CM | POA: Insufficient documentation

## 2018-08-27 LAB — CBC
HCT: 43.6 % (ref 36.0–46.0)
Hemoglobin: 14.3 g/dL (ref 12.0–15.0)
MCH: 32.6 pg (ref 26.0–34.0)
MCHC: 32.8 g/dL (ref 30.0–36.0)
MCV: 99.5 fL (ref 80.0–100.0)
Platelets: 265 10*3/uL (ref 150–400)
RBC: 4.38 MIL/uL (ref 3.87–5.11)
RDW: 12.1 % (ref 11.5–15.5)
WBC: 6.6 10*3/uL (ref 4.0–10.5)
nRBC: 0 % (ref 0.0–0.2)

## 2018-08-27 LAB — TYPE AND SCREEN
ABO/RH(D): A POS
Antibody Screen: NEGATIVE

## 2018-08-27 LAB — SARS CORONAVIRUS 2 (TAT 6-24 HRS): SARS Coronavirus 2: NEGATIVE

## 2018-08-31 ENCOUNTER — Ambulatory Visit: Payer: Medicaid Other | Admitting: Anesthesiology

## 2018-08-31 ENCOUNTER — Encounter: Payer: Self-pay | Admitting: *Deleted

## 2018-08-31 ENCOUNTER — Encounter
Admission: RE | Disposition: A | Discharge status: Home / Self Care | Payer: Self-pay | Source: Home / Self Care | Attending: Obstetrics & Gynecology

## 2018-08-31 ENCOUNTER — Other Ambulatory Visit: Payer: Self-pay

## 2018-08-31 ENCOUNTER — Other Ambulatory Visit: Payer: Self-pay | Admitting: Obstetrics & Gynecology

## 2018-08-31 ENCOUNTER — Ambulatory Visit
Admission: RE | Admit: 2018-08-31 | Discharge: 2018-08-31 | Disposition: A | Discharge status: Home / Self Care | Payer: Medicaid Other | Attending: Obstetrics & Gynecology | Admitting: Obstetrics & Gynecology

## 2018-08-31 DIAGNOSIS — J45909 Unspecified asthma, uncomplicated: Secondary | ICD-10-CM | POA: Diagnosis not present

## 2018-08-31 DIAGNOSIS — Z886 Allergy status to analgesic agent status: Secondary | ICD-10-CM | POA: Diagnosis not present

## 2018-08-31 DIAGNOSIS — Z79899 Other long term (current) drug therapy: Secondary | ICD-10-CM | POA: Insufficient documentation

## 2018-08-31 DIAGNOSIS — N83202 Unspecified ovarian cyst, left side: Secondary | ICD-10-CM | POA: Diagnosis present

## 2018-08-31 DIAGNOSIS — F319 Bipolar disorder, unspecified: Secondary | ICD-10-CM | POA: Diagnosis not present

## 2018-08-31 DIAGNOSIS — Z885 Allergy status to narcotic agent status: Secondary | ICD-10-CM | POA: Diagnosis not present

## 2018-08-31 DIAGNOSIS — M1710 Unilateral primary osteoarthritis, unspecified knee: Secondary | ICD-10-CM | POA: Diagnosis not present

## 2018-08-31 DIAGNOSIS — R1032 Left lower quadrant pain: Secondary | ICD-10-CM | POA: Diagnosis present

## 2018-08-31 DIAGNOSIS — R102 Pelvic and perineal pain: Secondary | ICD-10-CM | POA: Diagnosis present

## 2018-08-31 DIAGNOSIS — Z79891 Long term (current) use of opiate analgesic: Secondary | ICD-10-CM | POA: Diagnosis not present

## 2018-08-31 DIAGNOSIS — F17200 Nicotine dependence, unspecified, uncomplicated: Secondary | ICD-10-CM | POA: Diagnosis not present

## 2018-08-31 DIAGNOSIS — D271 Benign neoplasm of left ovary: Secondary | ICD-10-CM | POA: Insufficient documentation

## 2018-08-31 HISTORY — PX: LAPAROSCOPIC OVARIAN CYSTECTOMY: SHX6248

## 2018-08-31 LAB — POCT PREGNANCY, URINE: Preg Test, Ur: NEGATIVE

## 2018-08-31 LAB — ABO/RH: ABO/RH(D): A POS

## 2018-08-31 LAB — GLUCOSE, CAPILLARY: Glucose-Capillary: 81 mg/dL (ref 70–99)

## 2018-08-31 SURGERY — EXCISION, CYST, OVARY, LAPAROSCOPIC
Anesthesia: General | Site: Abdomen

## 2018-08-31 MED ORDER — BUPIVACAINE HCL (PF) 0.5 % IJ SOLN
INTRAMUSCULAR | Status: AC
  Filled 2018-08-31: qty 30

## 2018-08-31 MED ORDER — BUPIVACAINE HCL (PF) 0.5 % IJ SOLN
INTRAMUSCULAR | Status: DC | PRN
  Administered 2018-08-31: 17 mL

## 2018-08-31 MED ORDER — OXYCODONE-ACETAMINOPHEN 5-325 MG PO TABS: 1.0000 | ORAL_TABLET | ORAL | 0 refills | Status: DC | PRN

## 2018-08-31 MED ORDER — FENTANYL CITRATE (PF) 100 MCG/2ML IJ SOLN
INTRAMUSCULAR | Status: AC
  Administered 2018-08-31: 25 ug via INTRAVENOUS
  Filled 2018-08-31: qty 2

## 2018-08-31 MED ORDER — ROCURONIUM BROMIDE 100 MG/10ML IV SOLN
INTRAVENOUS | Status: DC | PRN
  Administered 2018-08-31: 20 mg via INTRAVENOUS
  Administered 2018-08-31: 30 mg via INTRAVENOUS

## 2018-08-31 MED ORDER — ACETAMINOPHEN 10 MG/ML IV SOLN
INTRAVENOUS | Status: DC | PRN
  Administered 2018-08-31: 1000 mg via INTRAVENOUS

## 2018-08-31 MED ORDER — FENTANYL CITRATE (PF) 100 MCG/2ML IJ SOLN
25.0000 ug | INTRAMUSCULAR | Status: DC | PRN
  Administered 2018-08-31 (×3): 25 ug via INTRAVENOUS

## 2018-08-31 MED ORDER — DEXAMETHASONE SODIUM PHOSPHATE 10 MG/ML IJ SOLN
INTRAMUSCULAR | Status: DC | PRN
  Administered 2018-08-31: 5 mg via INTRAVENOUS

## 2018-08-31 MED ORDER — LIDOCAINE HCL (PF) 2 % IJ SOLN
INTRAMUSCULAR | Status: AC
  Filled 2018-08-31: qty 10

## 2018-08-31 MED ORDER — LACTATED RINGERS IV SOLN
INTRAVENOUS | Status: DC
  Administered 2018-08-31: 12:00:00 via INTRAVENOUS

## 2018-08-31 MED ORDER — DEXAMETHASONE SODIUM PHOSPHATE 10 MG/ML IJ SOLN
INTRAMUSCULAR | Status: AC
  Filled 2018-08-31: qty 1

## 2018-08-31 MED ORDER — FENTANYL CITRATE (PF) 100 MCG/2ML IJ SOLN
INTRAMUSCULAR | Status: AC
  Filled 2018-08-31: qty 2

## 2018-08-31 MED ORDER — OXYCODONE HCL 5 MG/5ML PO SOLN: 5.0000 mg | Freq: Once | ORAL | Status: AC | PRN

## 2018-08-31 MED ORDER — ONDANSETRON HCL 4 MG/2ML IJ SOLN
INTRAMUSCULAR | Status: AC
  Filled 2018-08-31: qty 2

## 2018-08-31 MED ORDER — FAMOTIDINE 20 MG PO TABS
20.0000 mg | ORAL_TABLET | Freq: Once | ORAL | Status: AC
  Administered 2018-08-31: 20 mg via ORAL

## 2018-08-31 MED ORDER — ONDANSETRON HCL 4 MG/2ML IJ SOLN
INTRAMUSCULAR | Status: DC | PRN
  Administered 2018-08-31: 4 mg via INTRAVENOUS

## 2018-08-31 MED ORDER — SUGAMMADEX SODIUM 200 MG/2ML IV SOLN
INTRAVENOUS | Status: DC | PRN
  Administered 2018-08-31: 200 mg via INTRAVENOUS

## 2018-08-31 MED ORDER — ROCURONIUM BROMIDE 50 MG/5ML IV SOLN
INTRAVENOUS | Status: AC
  Filled 2018-08-31: qty 1

## 2018-08-31 MED ORDER — PROMETHAZINE HCL 25 MG/ML IJ SOLN: 6.2500 mg | INTRAMUSCULAR | Status: DC | PRN

## 2018-08-31 MED ORDER — KETOROLAC TROMETHAMINE 30 MG/ML IJ SOLN: 30.0000 mg | Freq: Four times a day (QID) | INTRAMUSCULAR | Status: DC

## 2018-08-31 MED ORDER — PROPOFOL 10 MG/ML IV BOLUS
INTRAVENOUS | Status: DC | PRN
  Administered 2018-08-31: 180 mg via INTRAVENOUS

## 2018-08-31 MED ORDER — HYDROCODONE-ACETAMINOPHEN 5-325 MG PO TABS: 1.0000 | ORAL_TABLET | Freq: Four times a day (QID) | ORAL | 0 refills | Status: DC | PRN

## 2018-08-31 MED ORDER — ACETAMINOPHEN 325 MG PO TABS: 650.0000 mg | ORAL_TABLET | ORAL | Status: DC | PRN

## 2018-08-31 MED ORDER — GLYCOPYRROLATE 0.2 MG/ML IJ SOLN
INTRAMUSCULAR | Status: DC | PRN
  Administered 2018-08-31: 0.2 mg via INTRAVENOUS

## 2018-08-31 MED ORDER — MIDAZOLAM HCL 2 MG/2ML IJ SOLN
INTRAMUSCULAR | Status: AC
  Filled 2018-08-31: qty 2

## 2018-08-31 MED ORDER — SUCCINYLCHOLINE CHLORIDE 20 MG/ML IJ SOLN
INTRAMUSCULAR | Status: AC
  Filled 2018-08-31: qty 1

## 2018-08-31 MED ORDER — LACTATED RINGERS IV SOLN: INTRAVENOUS | Status: DC

## 2018-08-31 MED ORDER — MEPERIDINE HCL 50 MG/ML IJ SOLN: 6.2500 mg | INTRAMUSCULAR | Status: DC | PRN

## 2018-08-31 MED ORDER — FAMOTIDINE 20 MG PO TABS
ORAL_TABLET | ORAL | Status: AC
  Administered 2018-08-31: 20 mg via ORAL
  Filled 2018-08-31: qty 1

## 2018-08-31 MED ORDER — MIDAZOLAM HCL 5 MG/5ML IJ SOLN
INTRAMUSCULAR | Status: DC | PRN
  Administered 2018-08-31: 2 mg via INTRAVENOUS

## 2018-08-31 MED ORDER — PROPOFOL 10 MG/ML IV BOLUS
INTRAVENOUS | Status: AC
  Filled 2018-08-31: qty 20

## 2018-08-31 MED ORDER — KETOROLAC TROMETHAMINE 30 MG/ML IJ SOLN
INTRAMUSCULAR | Status: DC | PRN
  Administered 2018-08-31: 30 mg via INTRAVENOUS

## 2018-08-31 MED ORDER — MORPHINE SULFATE (PF) 4 MG/ML IV SOLN: 1.0000 mg | INTRAVENOUS | Status: DC | PRN

## 2018-08-31 MED ORDER — ACETAMINOPHEN 650 MG RE SUPP: 650.0000 mg | RECTAL | Status: DC | PRN

## 2018-08-31 MED ORDER — OXYCODONE HCL 5 MG PO TABS
5.0000 mg | ORAL_TABLET | Freq: Once | ORAL | Status: AC | PRN
  Administered 2018-08-31: 15:00:00 5 mg via ORAL

## 2018-08-31 MED ORDER — ACETAMINOPHEN 10 MG/ML IV SOLN
INTRAVENOUS | Status: AC
  Filled 2018-08-31: qty 100

## 2018-08-31 MED ORDER — FENTANYL CITRATE (PF) 100 MCG/2ML IJ SOLN
INTRAMUSCULAR | Status: DC | PRN
  Administered 2018-08-31: 100 ug via INTRAVENOUS
  Administered 2018-08-31 (×2): 50 ug via INTRAVENOUS

## 2018-08-31 MED ORDER — LIDOCAINE HCL (CARDIAC) PF 100 MG/5ML IV SOSY
PREFILLED_SYRINGE | INTRAVENOUS | Status: DC | PRN
  Administered 2018-08-31: 60 mg via INTRAVENOUS

## 2018-08-31 MED ORDER — OXYCODONE HCL 5 MG PO TABS
ORAL_TABLET | ORAL | Status: AC
  Administered 2018-08-31: 5 mg via ORAL
  Filled 2018-08-31: qty 1

## 2018-08-31 SURGICAL SUPPLY — 40 items
BLADE SURG SZ11 CARB STEEL (BLADE) ×2 IMPLANT
CANISTER SUCT 1200ML W/VALVE (MISCELLANEOUS) ×2 IMPLANT
CATH ROBINSON RED A/P 16FR (CATHETERS) ×2 IMPLANT
CHLORAPREP W/TINT 26 (MISCELLANEOUS) ×2 IMPLANT
COVER WAND RF STERILE (DRAPES) IMPLANT
DERMABOND ADVANCED (GAUZE/BANDAGES/DRESSINGS) ×1
DERMABOND ADVANCED .7 DNX12 (GAUZE/BANDAGES/DRESSINGS) ×1 IMPLANT
DRSG TELFA 4X3 1S NADH ST (GAUZE/BANDAGES/DRESSINGS) IMPLANT
GLOVE BIO SURGEON STRL SZ7 (GLOVE) ×1 IMPLANT
GLOVE BIO SURGEON STRL SZ8 (GLOVE) ×4 IMPLANT
GLOVE INDICATOR 8.0 STRL GRN (GLOVE) ×2 IMPLANT
GOWN STRL REUS W/ TWL LRG LVL3 (GOWN DISPOSABLE) ×1 IMPLANT
GOWN STRL REUS W/ TWL XL LVL3 (GOWN DISPOSABLE) ×1 IMPLANT
GOWN STRL REUS W/TWL LRG LVL3 (GOWN DISPOSABLE) ×2
GOWN STRL REUS W/TWL XL LVL3 (GOWN DISPOSABLE) ×1
GRASPER SUT TROCAR 14GX15 (MISCELLANEOUS) ×2 IMPLANT
IRRIGATION STRYKERFLOW (MISCELLANEOUS) IMPLANT
IRRIGATOR STRYKERFLOW (MISCELLANEOUS) ×2
IV LACTATED RINGERS 1000ML (IV SOLUTION) IMPLANT
KIT PINK PAD W/HEAD ARE REST (MISCELLANEOUS) ×2
KIT PINK PAD W/HEAD ARM REST (MISCELLANEOUS) ×1 IMPLANT
LABEL OR SOLS (LABEL) ×2 IMPLANT
NEEDLE VERESS 14GA 120MM (NEEDLE) ×2 IMPLANT
NS IRRIG 500ML POUR BTL (IV SOLUTION) ×2 IMPLANT
PACK GYN LAPAROSCOPIC (MISCELLANEOUS) ×2 IMPLANT
PAD PREP 24X41 OB/GYN DISP (PERSONAL CARE ITEMS) ×2 IMPLANT
POUCH SPECIMEN RETRIEVAL 10MM (ENDOMECHANICALS) IMPLANT
SCISSORS METZENBAUM CVD 33 (INSTRUMENTS) ×2 IMPLANT
SET TUBE SMOKE EVAC HIGH FLOW (TUBING) ×2 IMPLANT
SHEARS HARMONIC ACE PLUS 36CM (ENDOMECHANICALS) IMPLANT
SLEEVE ENDOPATH XCEL 5M (ENDOMECHANICALS) ×1 IMPLANT
SPONGE GAUZE 2X2 8PLY STRL LF (GAUZE/BANDAGES/DRESSINGS) IMPLANT
STRAP SAFETY 5IN WIDE (MISCELLANEOUS) ×2 IMPLANT
SUT VIC AB 0 CT1 36 (SUTURE) ×2 IMPLANT
SUT VIC AB 2-0 UR6 27 (SUTURE) IMPLANT
SUT VIC AB 4-0 PS2 18 (SUTURE) IMPLANT
SYR 10ML LL (SYRINGE) ×2 IMPLANT
SYSTEM WECK SHIELD CLOSURE (TROCAR) IMPLANT
TROCAR ENDO BLADELESS 11MM (ENDOMECHANICALS) IMPLANT
TROCAR XCEL NON-BLD 5MMX100MML (ENDOMECHANICALS) ×2 IMPLANT

## 2018-08-31 NOTE — Discharge Instructions (Signed)
Minimally Invasive Ovarian Surgery, Care After This sheet gives you information about how to care for yourself after your procedure. Your health care provider may also give you more specific instructions. If you have problems or questions, contact your health care provider. What can I expect after the procedure? After the procedure, it is common to have:  Mild cramping in your lower abdomen.  Tiredness (fatigue). You may feel tired for a few days. Follow these instructions at home: Incision care   Follow instructions from your health care provider about how to take care of your incisions. Make sure you: ? Wash your hands with soap and water before and after you change your bandage (dressing). If soap and water are not available, use hand sanitizer. ? Change your dressing as told by your health care provider. ? Leave stitches (sutures), skin glue, or adhesive strips in place. These skin closures may need to stay in place for 2 weeks or longer. If adhesive strip edges start to loosen and curl up, you may trim the loose edges. Do not remove adhesive strips completely unless your health care provider tells you to do that.  Check your incision areas every day for signs of infection. Check for: ? Redness, swelling, or pain that gets worse. ? Fluid or blood. ? Warmth. ? Pus or a bad smell.  Keep your incision areas clean and dry.  Do not take baths, swim, or use a hot tub until your health care provider approves. Ask your health care provider if you may take showers. You may only be allowed to take sponge baths. Activity  Return to your normal activities as told by your health care provider. Ask your health care provider what activities are safe for you. Ask about: ? Returning to work and exercise. ? Any weight restrictions you may have. ? When you can resume sexual activity.  Rest as told by your health care provider.  Avoid sitting for a long time without moving. Get up to take short  walks every 1-2 hours. This is important to improve blood flow and breathing. Ask for help if you feel weak or unsteady. General instructions  Take over-the-counter and prescription medicines only as told by your health care provider.  Ask your health care provider if the medicine prescribed to you: ? Requires you to avoid driving or using heavy machinery. ? Can cause constipation. You may need to take these actions to prevent or treat constipation:  Drink enough fluid to keep your urine pale yellow.  Take over-the-counter or prescription medicines.  Eat foods that are high in fiber, such as beans, whole grains, and fresh fruits and vegetables.  Limit foods that are high in fat and processed sugars, such as fried or sweet foods.  Keep all follow-up visits as told by your health care provider. This is important. Contact a health care provider if:  You have unusual bleeding or discharge from your vagina.  You have pain in your abdomen that gets worse or does not get better with medicine.  You feel nauseous.  You have redness, swelling, or pain around any of your incisions.  You have fluid or blood coming from any of your incisions.  Any of your incisions feel warm to the touch.  You have pus or a bad smell coming from any of your incisions. Get help right away if:  You have a fever.  You have a lot of unusual bleeding or discharge from your vagina.  You have severe pain in  your abdomen.  You cannot stop vomiting.  You have nausea that does not get better.  You faint.  You are unable to empty your bladder. Summary  After the procedure, it is common to have mild cramping and to feel tired for a few days.  Return to your normal activities as told by your health care provider. Ask your health care provider what activities are safe for you.  Contact a health care provider if you have complications, such as unusual bleeding or discharge, nausea, pain that gets worse, or  fluid or blood coming from any of your incisions.  Keep all follow-up visits as told by your health care provider. This information is not intended to replace advice given to you by your health care provider. Make sure you discuss any questions you have with your health care provider. Document Released: 01/16/2011 Document Revised: 04/14/2018 Document Reviewed: 04/14/2018 Elsevier Patient Education  [place]   1) The drugs that you were given will stay in your system until tomorrow so for the next 24 hours you should not:  A) Drive an automobile B) Make any legal decisions C) Drink any alcoholic beverage   2) You may resume regular meals tomorrow.  Today it is better to start with liquids and gradually work up to solid foods.  You may eat anything you prefer, but it is better to start with liquids, then soup and crackers, and gradually work up to solid foods.   3) Please notify your doctor immediately if you have any unusual bleeding, trouble breathing, redness and pain at the surgery site, drainage, fever, or pain not relieved by medication.    4) Additional Instructions:        Please contact your physician with any problems or Same Day Surgery at [phone], Monday through Friday 6 am to 4 pm, or [place] at [facility] number at [phone].

## 2018-08-31 NOTE — Anesthesia Post-op Follow-up Note (Signed)
Anesthesia QCDR form completed.        

## 2018-08-31 NOTE — Anesthesia Preprocedure Evaluation (Signed)
Anesthesia Evaluation  Patient identified by MRN, date of birth, ID band Patient awake    Reviewed: Allergy & Precautions, NPO status , Patient's Chart, lab work & pertinent test results  History of Anesthesia Complications Negative for: history of anesthetic complications  Airway Mallampati: II  TM Distance: >3 FB Neck ROM: Full    Dental no notable dental hx.    Pulmonary asthma , Current Smoker,    breath sounds clear to auscultation- rhonchi (-) wheezing      Cardiovascular (-) hypertension(-) CAD, (-) Past MI, (-) Cardiac Stents and (-) CABG  Rhythm:Regular Rate:Normal - Systolic murmurs and - Diastolic murmurs    Neuro/Psych  Headaches, PSYCHIATRIC DISORDERS Depression Bipolar Disorder    GI/Hepatic negative GI ROS, Neg liver ROS,   Endo/Other  negative endocrine ROSneg diabetes  Renal/GU negative Renal ROS     Musculoskeletal  (+) Arthritis ,   Abdominal (+) + obese,   Peds  Hematology negative hematology ROS (+)   Anesthesia Other Findings Past Medical History: No date: Arthritis     Comment:  KNEE No date: Asthma     Comment:  WELL CONTROLLED No date: Bipolar 1 disorder (HCC) No date: Depression No date: Headache(784.0)   Reproductive/Obstetrics                             Anesthesia Physical Anesthesia Plan  ASA: II  Anesthesia Plan: General   Post-op Pain Management:    Induction: Intravenous  PONV Risk Score and Plan: 1 and Ondansetron, Dexamethasone and Midazolam  Airway Management Planned: Oral ETT  Additional Equipment:   Intra-op Plan:   Post-operative Plan: Extubation in OR  Informed Consent: I have reviewed the patients History and Physical, chart, labs and discussed the procedure including the risks, benefits and alternatives for the proposed anesthesia with the patient or authorized representative who has indicated his/her understanding and  acceptance.     Dental advisory given  Plan Discussed with: CRNA and Anesthesiologist  Anesthesia Plan Comments:         Anesthesia Quick Evaluation

## 2018-08-31 NOTE — Anesthesia Postprocedure Evaluation (Signed)
Anesthesia Post Note  Patient: Ashley Moyer  Procedure(s) Performed: LAPAROSCOPIC OVARIAN CYSTECTOMY (N/A Abdomen)  Patient location during evaluation: PACU Anesthesia Type: General Level of consciousness: awake and alert and oriented Pain management: pain level controlled Vital Signs Assessment: post-procedure vital signs reviewed and stable Respiratory status: spontaneous breathing, nonlabored ventilation and respiratory function stable Cardiovascular status: blood pressure returned to baseline and stable Postop Assessment: no signs of nausea or vomiting Anesthetic complications: no     Last Vitals:  Vitals:   08/31/18 1401 08/31/18 1423  BP: 112/75 109/78  Pulse: 67 67  Resp: 17 16  Temp: 36.6 C 36.6 C  SpO2: 99% 100%    Last Pain:  Vitals:   08/31/18 1432  TempSrc:   PainSc: 7                  [name]

## 2018-08-31 NOTE — Interval H&P Note (Signed)
History and Physical Interval Note:  08/31/2018 11:18 AM  Ashley Moyer  has presented today for surgery, with the diagnosis of N83.202 LEFT OVARION CYST.  The various methods of treatment have been discussed with the patient and family. After consideration of risks, benefits and other options for treatment, the patient has consented to  Procedure(s): LAPAROSCOPIC OVARIAN CYSTECTOMY (N/A) as a surgical intervention.  The patient's history has been reviewed, patient examined, no change in status, stable for surgery.  I have reviewed the patient's chart and labs.  Questions were answered to the patient's satisfaction.     Hoyt Koch

## 2018-08-31 NOTE — Anesthesia Procedure Notes (Signed)

## 2018-08-31 NOTE — Transfer of Care (Signed)
Immediate Anesthesia Transfer of Care Note  Patient: Ashley Moyer  Procedure(s) Performed: LAPAROSCOPIC OVARIAN CYSTECTOMY (N/A Abdomen)  Patient Location: PACU  Anesthesia Type:General  Level of Consciousness: sedated  Airway & Oxygen Therapy: Patient Spontanous Breathing and Patient connected to face mask oxygen  Post-op Assessment: Report given to RN and Post -op Vital signs reviewed and stable  Post vital signs: Reviewed and stable  Last Vitals:  Vitals Value Taken Time  BP 127/85 08/31/18 1311  Temp 36.2 C 08/31/18 1310  Pulse 53 08/31/18 1319  Resp 14 08/31/18 1319  SpO2 100 % 08/31/18 1319  Vitals shown include unvalidated device data.  Last Pain:  Vitals:   08/31/18 1025  TempSrc: Oral  PainSc: 6          Complications: No apparent anesthesia complications

## 2018-08-31 NOTE — Op Note (Signed)
  Operative Note   08/31/2018  PRE-OP DIAGNOSIS: Left Ovarian Cyst, Pelvic Pain   POST-OP DIAGNOSIS: same   PROCEDURE: Procedure(s): LAPAROSCOPIC OVARIAN CYSTECTOMY   SURGEON: Barnett Applebaum, MD, FACOG  ASST: Dr Georgianne Fick, No other capable assistant available, in surgery requiring high level assistant.  ANESTHESIA: Choice   ESTIMATED BLOOD LOSS: Min  COMPLICATIONS: None  DISPOSITION: PACU - hemodynamically stable.  CONDITION: stable  FINDINGS: Laparoscopic survey of the abdomen revealed a grossly normal uterus, tubes, ovaries, liver edge, gallbladder edge and appendix, No intra-abdominal adhesions were noted. Left Ovarian Cyst noted.  PROCEDURE IN DETAIL: The patient was taken to the OR where anesthesia was administed. The patient was positioned in dorsal lithotomy in the [place]. The patient was then examined under anesthesia with the above noted findings. The patient was prepped and draped in the normal sterile fashion and foley catheter was placed. A Graves speculum was placed in the vagina and the anterior lip of the cervix was grasped with a single toothed tenaculum. A Hulka uterine manipulator was then inserted in the uterus. Uterine mobility was found to be satisfactory. The speculum was then removed.  Attention was turned to the patient's abdomen where a 5 mm skin incision was made in the umbilical fold, after injection of local anesthesia. The Veress step needle was carefully introduced into the peritoneal cavity with placement confirmed using the hanging drop technique.  Pneumoperitoneum was obtained. The 5 mm port was then placed under direct visualization with the operative laparoscope.  Trendelenburg positioning.  Additional 52mm trocar was then placed in the RLQ lateral to the inferior epigastric blood vessels under direct visualization with the laparoscope.  Instrumentation to visualize complete pelvic anatomy performed.  A 33mm trocar was also then placed in the LLQ.   The ovarian cyst is identified and stabilized.  An incision is made with clear fluid noted.  Fluid is aspirated.  Cyst wall is dissected free from the ovarian cortex and removed.  Hemostasis is visualized and assured.  Contralateral ovary seen as normal.  Pelvic cavity is cleaned with any fluid aspirated.  Instruments and trocars removed, gas expelled, and skin closed with skin adhesive glue.  Instrument, needle, and sponge counts correct x2 at the conclusion of the case.  Pt goes to recovery room in stable condition.  Barnett Applebaum, MD, [name], [facility] 08/31/2018  1:05 PM

## 2018-09-02 LAB — SURGICAL PATHOLOGY

## 2018-09-13 ENCOUNTER — Ambulatory Visit: Payer: Medicaid Other | Admitting: Obstetrics & Gynecology

## 2018-11-29 ENCOUNTER — Other Ambulatory Visit: Payer: Self-pay

## 2018-11-29 DIAGNOSIS — Z20822 Contact with and (suspected) exposure to covid-19: Secondary | ICD-10-CM

## 2018-12-01 LAB — NOVEL CORONAVIRUS, NAA: SARS-CoV-2, NAA: NOT DETECTED

## 2018-12-30 ENCOUNTER — Other Ambulatory Visit: Payer: Self-pay

## 2018-12-30 ENCOUNTER — Emergency Department
Admission: EM | Admit: 2018-12-30 | Discharge: 2018-12-30 | Disposition: A | Discharge status: Home / Self Care | Payer: Medicaid Other | Attending: Emergency Medicine | Admitting: Emergency Medicine

## 2018-12-30 ENCOUNTER — Encounter: Payer: Self-pay | Admitting: *Deleted

## 2018-12-30 ENCOUNTER — Emergency Department: Payer: Medicaid Other

## 2018-12-30 DIAGNOSIS — R109 Unspecified abdominal pain: Secondary | ICD-10-CM | POA: Diagnosis present

## 2018-12-30 DIAGNOSIS — Z5321 Procedure and treatment not carried out due to patient leaving prior to being seen by health care provider: Secondary | ICD-10-CM | POA: Insufficient documentation

## 2018-12-30 DIAGNOSIS — R52 Pain, unspecified: Secondary | ICD-10-CM

## 2018-12-30 LAB — URINALYSIS, COMPLETE (UACMP) WITH MICROSCOPIC
Bacteria, UA: NONE SEEN
Bilirubin Urine: NEGATIVE
Glucose, UA: NEGATIVE mg/dL
Ketones, ur: NEGATIVE mg/dL
Nitrite: NEGATIVE
Protein, ur: NEGATIVE mg/dL
Specific Gravity, Urine: 1.018 (ref 1.005–1.030)
pH: 6 (ref 5.0–8.0)

## 2018-12-30 LAB — COMPREHENSIVE METABOLIC PANEL
ALT: 27 U/L (ref 0–44)
AST: 21 U/L (ref 15–41)
Albumin: 3.8 g/dL (ref 3.5–5.0)
Alkaline Phosphatase: 64 U/L (ref 38–126)
Anion gap: 8 (ref 5–15)
BUN: 15 mg/dL (ref 6–20)
CO2: 24 mmol/L (ref 22–32)
Calcium: 8.9 mg/dL (ref 8.9–10.3)
Chloride: 107 mmol/L (ref 98–111)
Creatinine, Ser: 0.9 mg/dL (ref 0.44–1.00)
GFR calc Af Amer: >60 mL/min (ref >60)
GFR calc non Af Amer: >60 mL/min (ref >60)
Glucose, Bld: 106 mg/dL — ABNORMAL HIGH (ref 70–99)
Potassium: 3.8 mmol/L (ref 3.5–5.1)
Sodium: 139 mmol/L (ref 135–145)
Total Bilirubin: 0.5 mg/dL (ref 0.3–1.2)
Total Protein: 6.9 g/dL (ref 6.5–8.1)

## 2018-12-30 LAB — CBC
HCT: 40.8 % (ref 36.0–46.0)
Hemoglobin: 13.6 g/dL (ref 12.0–15.0)
MCH: 32.1 pg (ref 26.0–34.0)
MCHC: 33.3 g/dL (ref 30.0–36.0)
MCV: 96.2 fL (ref 80.0–100.0)
Platelets: 271 10*3/uL (ref 150–400)
RBC: 4.24 MIL/uL (ref 3.87–5.11)
RDW: 12.5 % (ref 11.5–15.5)
WBC: 12.5 10*3/uL — ABNORMAL HIGH (ref 4.0–10.5)
nRBC: 0 % (ref 0.0–0.2)

## 2018-12-30 LAB — LIPASE, BLOOD: Lipase: 29 U/L (ref 11–51)

## 2018-12-30 LAB — POCT PREGNANCY, URINE: Preg Test, Ur: NEGATIVE

## 2018-12-30 MED ORDER — SODIUM CHLORIDE 0.9% FLUSH: 3.0000 mL | Freq: Once | INTRAVENOUS | Status: DC

## 2018-12-30 NOTE — ED Notes (Signed)
Called several times from lobby with no answer

## 2018-12-30 NOTE — ED Notes (Signed)
No answer when called several times from lobby 

## 2018-12-30 NOTE — ED Triage Notes (Signed)
Pt has right lower abd pain.  Pt has spotting.  No vag discharge.  No urinary sx.  Pt had surgery last month on left ovary.  Pt alert speech clear.

## 2018-12-30 NOTE — ED Notes (Signed)
poct pregnancy Negative 

## 2018-12-31 ENCOUNTER — Telehealth: Payer: Self-pay | Admitting: Emergency Medicine

## 2018-12-31 NOTE — Telephone Encounter (Addendum)
Called patient due to lwot to inquire about condition and follow up plans. No answer and no voicemail.  Patient did call back to desk and spoke with Fletcher Anon.  Per Anne Ng, patient has arranged follow up already.

## 2019-01-26 ENCOUNTER — Telehealth: Payer: Self-pay | Admitting: Obstetrics & Gynecology

## 2019-01-26 NOTE — Telephone Encounter (Signed)
-----   Message from Gae Dry, MD sent at 01/25/2019  5:12 PM EST ----- Regarding: See if patient needs f/u appt for ovarian cyst w [facility]

## 2019-01-26 NOTE — Telephone Encounter (Signed)
Patient is schedule 02/22/19 with [facility]

## 2019-02-22 ENCOUNTER — Ambulatory Visit (INDEPENDENT_AMBULATORY_CARE_PROVIDER_SITE_OTHER): Payer: Medicaid Other | Admitting: Obstetrics & Gynecology

## 2019-02-22 ENCOUNTER — Other Ambulatory Visit: Payer: Self-pay

## 2019-02-22 ENCOUNTER — Encounter: Payer: Self-pay | Admitting: Obstetrics & Gynecology

## 2019-02-22 ENCOUNTER — Telehealth: Payer: Self-pay | Admitting: Obstetrics & Gynecology

## 2019-02-22 VITALS — BP 120/70 | Ht 67.0 in | Wt 265.0 lb

## 2019-02-22 DIAGNOSIS — D271 Benign neoplasm of left ovary: Secondary | ICD-10-CM

## 2019-02-22 DIAGNOSIS — R1032 Left lower quadrant pain: Secondary | ICD-10-CM

## 2019-02-22 MED ORDER — TRAMADOL HCL 50 MG PO TABS: 50.0000 mg | ORAL_TABLET | Freq: Two times a day (BID) | ORAL | 0 refills | Status: DC | PRN

## 2019-02-22 NOTE — Patient Instructions (Signed)
Unilateral Salpingo-Oophorectomy, Care After This sheet gives you information about how to care for yourself after your procedure. Your health care provider may also give you more specific instructions. If you have problems or questions, contact your health care provider. What can I expect after the procedure? After the procedure, it is common to have:  Abdominal pain.  Some occasional vaginal bleeding (spotting).  Tiredness. Follow these instructions at home: Incision care   Keep your incision area and your bandage (dressing) clean and dry.  Follow instructions from your health care provider about how to take care of your incision. Make sure you: ? Wash your hands with soap and water before you change your dressing. If soap and water are not available, use hand sanitizer. ? Change your dressing as told by your health care provider. ? Leave stitches (sutures), staples, skin glue, or adhesive strips in place. These skin closures may need to stay in place for 2 weeks or longer. If adhesive strip edges start to loosen and curl up, you may trim the loose edges. Do not remove adhesive strips completely unless your health care provider tells you to do that.  Check your incision area every day for signs of infection. Check for: ? Redness, swelling, or pain. ? Fluid or blood. ? Warmth. ? Pus or a bad smell. Activity  Do not drive or use heavy machinery while taking prescription pain medicine.  Do not drive for 24 hours if you received a medicine to help you relax (sedative).  Take frequent, short walks throughout the day. Rest when you get tired. Ask your health care provider what activities are safe for you.  Avoid activities that require great effort. Also, avoid heavy lifting. Do not lift anything that is heavier than 5 lb (2.3 kg), or the limit that your health care provider tells you, until he or she says that it is safe to do so.  Do not douche, use tampons, or have sex until your  health care provider approves. General instructions  To prevent or treat constipation while you are taking prescription pain medicine, your health care provider may recommend that you: ? Drink enough fluid to keep your urine pale yellow. ? Take over-the-counter or prescription medicines. ? Eat foods that are high in fiber, such as fresh fruits and vegetables, whole grains, and beans. ? Limit foods that are high in fat and processed sugars, such as fried and sweet foods.  Take over-the-counter and prescription medicines only as told by your health care provider.  Do not take baths, swim, or use a hot tub until your health care provider approves. Ask your health care provider if you may take showers. You may only be allowed to take sponge baths.  Wear compression stockings as told by your health care provider. These stockings help to prevent blood clots and reduce swelling in your legs.  Keep all follow-up visits as told by your health care provider. This is important. Contact a health care provider if:  You have pain when you urinate.  You have pus or a bad smelling discharge coming from your vagina.  You have redness, swelling, or pain around your incision.  You have fluid or blood coming from your incision.  Your incision feels warm to the touch.  You have pus or a bad smell coming from your incision.  You have a fever.  Your incision starts to break open.  You have abdominal pain that gets worse or does not get better with medicine.    You develop a rash.  You develop nausea and vomiting.  You feel lightheaded. Get help right away if:  You develop pain in your chest or leg.  You develop shortness of breath.  You faint.  You have increased bleeding from your vagina. Summary  After the procedure, it is common to have pain, tiredness, and occasional bleeding from the vagina.  Follow instructions from your health care provider about how to take care of your  incision.  Check your incision every day for signs of infection and report any symptoms to your health care provider.  Follow instructions from your health care provider about activities and restrictions. This information is not intended to replace advice given to you by your health care provider. Make sure you discuss any questions you have with your health care provider. Document Revised: 01/09/2017 Document Reviewed: 05/08/2016 Elsevier Patient Education  2020 [employer] Inc.  

## 2019-02-22 NOTE — Telephone Encounter (Signed)
-----   Message from Gae Dry, MD sent at 02/22/2019  4:00 PM EST ----- Regarding: Surgery- Pain Cyst Surgery Booking Request Patient Full Name:  Ashley Moyer  MRN: [id number]  DOB: 02/17/1990  Surgeon: Hoyt Koch, MD  Requested Surgery Date and Time: Soon Primary Diagnosis AND Code: LLQ pain, Dermoid cyst Secondary Diagnosis and Code: R10.32, D27.1 Surgical Procedure: LAPAROSCOPY with LEFT OOPHORECTOMY L&D Notification: No Admission Status: same day surgery Length of Surgery: 40 min Special Case Needs: No H&P: Yes Phone Interview???:  Yes Interpreter: No Language:  Medical Clearance:  No Special Scheduling Instructions: no Any known health/anesthesia issues, diabetes, sleep apnea, latex allergy, defibrillator/pacemaker?: No Acuity: P2   (P1 highest, P2 delay may cause harm, P3 low, elective gyn, P4 lowest) Priority 2

## 2019-02-22 NOTE — Progress Notes (Signed)
HPI: Patient is a 29 y.o. G2P1001 who LMP was No LMP recorded. as they are irreg, presents today for a problem visit.  She complains of recent findings of Left ovarion cyst by Ultrasound - Pelvic Vaginal, CT - Pelvis.  Pt has had symptoms of pain.  Pain began RLQ in Nov 2020, then became more diffuse then left sided.  No radiation, associated findings, modifiers, context.  CT and [place] in Nov suggested left sided 4 cm dermoid.  Previous evaluation: treated for cyst by lap and cystectomy that resulted in dermoid last summer.  Prior Diagnosis: Mature cystic teratoma. Previous Treatment: pain meds over the last month to help w pain.  PMHx: She  has a past medical history of Arthritis, Asthma, Bipolar 1 disorder ([place]), Depression, and Headache(784.0). Also,  has a past surgical history that includes Cholecystectomy; Wisdom tooth extraction; and Laparoscopic ovarian cystectomy (N/A, 08/31/2018)., family history includes Cancer in her maternal aunt, maternal grandmother, and mother.,  reports that she has been smoking cigarettes. She has a 2.25 pack-year smoking history. She has never used smokeless tobacco. She reports previous alcohol use. She reports that she does not use drugs.  She has a current medication list which includes the following prescription(s): acyclovir, albuterol, citalopram, docusate sodium, multivitamin, oxycodone-acetaminophen, prenatal multivitamin, and tramadol. Also, is allergic to codeine; ibuprofen; and tape.  Review of Systems  Constitutional: Positive for malaise/fatigue. Negative for chills and fever.  HENT: Negative for congestion, sinus pain and sore throat.   Eyes: Negative for blurred vision and pain.  Respiratory: Negative for cough and wheezing.   Cardiovascular: Negative for chest pain and leg swelling.  Gastrointestinal: Positive for abdominal pain. Negative for constipation, diarrhea, heartburn, nausea and vomiting.  Genitourinary: Negative for dysuria, frequency,  hematuria and urgency.  Musculoskeletal: Negative for back pain, joint pain, myalgias and neck pain.  Skin: Negative for itching and rash.  Neurological: Positive for headaches. Negative for dizziness, tremors and weakness.  Endo/Heme/Allergies: Does not bruise/bleed easily.  Psychiatric/Behavioral: Negative for depression. The patient is not nervous/anxious and does not have insomnia.     Objective: BP 120/70   Ht 5\' 7"  (1.702 m)   Wt 265 lb (120.2 kg)   BMI 41.50 kg/m  Physical Exam Constitutional:      General: She is not in acute distress.    Appearance: She is well-developed.  Genitourinary:     Pelvic exam was performed with patient supine.     Vagina and uterus normal.     No vaginal erythema or bleeding.     No cervical motion tenderness, discharge, polyp or nabothian cyst.     Uterus is mobile.     Uterus is not enlarged.     No uterine mass detected.    Uterus is midaxial.     No right or left adnexal mass present.     Right adnexa not tender.     Left adnexa not tender.     Genitourinary Comments: Mobile uterus Tender fullness left adnexa  HENT:     Head: Normocephalic and atraumatic.     Nose: Nose normal.  Abdominal:     General: There is no distension.     Palpations: Abdomen is soft.     Tenderness: There is no abdominal tenderness.  Musculoskeletal:        General: Normal range of motion.  Neurological:     Mental Status: She is alert and oriented to person, place, and time.     Cranial Nerves: No  cranial nerve deficit.  Skin:    General: Skin is warm and dry.  Psychiatric:        Attention and Perception: Attention normal.        Mood and Affect: Mood and affect normal.        Speech: Speech normal.        Behavior: Behavior normal.        Thought Content: Thought content normal.        Judgment: Judgment normal.     ASSESSMENT/PLAN:    Problem List Items Addressed This Visit      Other   LLQ abdominal pain - Primary   Relevant  Medications   traMADol (ULTRAM) 50 MG tablet    Other Visit Diagnoses    Dermoid cyst of left ovary        Options discussed, pain management and surgery.  Surgery would likely be Left Oophorectomy due to rapid reccurrence of dermoid.  Would rec preserving right ovary for hormones at her young age; risk of recurrence to right ovary possible and discussed.  A total of 35 minutes were spent face-to-face with the patient as well as preparation, review, communication, and documentation during this encounter.    Barnett Applebaum, MD, [name], [facility] 02/22/2019  4:00 PM

## 2019-02-22 NOTE — Telephone Encounter (Signed)
Patient returned the call and is aware of H&P on 1/18 @ 11am w/ Dr. Kenton Kingfisher, Pre-admit Testing phone interview to be scheduled, COVID testing on 1/19, and OR on 03/03/19. Patient is aware to quarantine after COVID testing. Patient is aware she may receive calls from the [place] and [facility] Patient confirmed Medicaid. Patient did not have any questions.

## 2019-02-22 NOTE — Telephone Encounter (Signed)
Lmtrc

## 2019-02-28 ENCOUNTER — Encounter: Payer: Self-pay | Admitting: Obstetrics & Gynecology

## 2019-02-28 ENCOUNTER — Ambulatory Visit (INDEPENDENT_AMBULATORY_CARE_PROVIDER_SITE_OTHER): Payer: Medicaid Other | Admitting: Obstetrics & Gynecology

## 2019-02-28 ENCOUNTER — Other Ambulatory Visit: Payer: Self-pay

## 2019-02-28 VITALS — BP 120/80 | Ht 67.0 in | Wt 265.0 lb

## 2019-02-28 DIAGNOSIS — R1032 Left lower quadrant pain: Secondary | ICD-10-CM

## 2019-02-28 DIAGNOSIS — D271 Benign neoplasm of left ovary: Secondary | ICD-10-CM

## 2019-02-28 NOTE — Progress Notes (Signed)
PRE-OPERATIVE HISTORY AND PHYSICAL EXAM  HPI:  Ashley Moyer is a 29 y.o. G2P1001 Patient's last menstrual period was 02/16/2019.; she is being admitted for surgery related to recurrent left sided ovarian dermoid cyst, with associated pelvic pain.  Pain began RLQ in Nov 2020, then became more diffuse then left sided.  No radiation, associated findings, modifiers, context.  CT and [place] in Nov suggested left sided 4 cm dermoid.  Previous evaluation: treated for cyst by lap and cystectomy that resulted in dermoid last summer.  Prior Diagnosis: Mature cystic teratoma.  PMHx: Past Medical History:  Diagnosis Date  . Arthritis    KNEE  . Asthma    WELL CONTROLLED  . Bipolar 1 disorder ([place])   . Depression   . [id number]    Past Surgical History:  Procedure Laterality Date  . CHOLECYSTECTOMY    . LAPAROSCOPIC OVARIAN CYSTECTOMY N/A 08/31/2018   Procedure: LAPAROSCOPIC OVARIAN CYSTECTOMY;  Surgeon: Gae Dry, MD;  Location: [facility] ORS;  Service: Gynecology;  Laterality: N/A;  . WISDOM TOOTH EXTRACTION     Family History  Problem Relation Age of Onset  . Cancer Mother   . Cancer Maternal Aunt   . Cancer Maternal Grandmother    Social History   Tobacco Use  . Smoking status: Current Every Day Smoker    Packs/day: 0.25    Years: 9.00    Pack years: 2.25    Types: Cigarettes  . Smokeless tobacco: Never Used  Substance Use Topics  . Alcohol use: Not Currently    Comment: rarely  . Drug use: No    Current Outpatient Medications:  .  albuterol (PROVENTIL HFA;VENTOLIN HFA) 108 (90 BASE) MCG/ACT inhaler, Inhale 2 puffs into the lungs every 6 (six) hours as needed for wheezing or shortness of breath., Disp: , Rfl:  .  aspirin-acetaminophen-caffeine (EXCEDRIN MIGRAINE) 250-250-65 MG tablet, Take 2 tablets by mouth every 6 (six) hours as needed for headache., Disp: , Rfl:  .  bismuth subsalicylate (PEPTO BISMOL) 262 MG/15ML suspension, Take 30 mLs by mouth every 6 (six)  hours as needed for indigestion., Disp: , Rfl:  .  traMADol (ULTRAM) 50 MG tablet, Take 1 tablet (50 mg total) by mouth every 12 (twelve) hours as needed. (Patient taking differently: Take 50 mg by mouth every 12 (twelve) hours as needed for moderate pain. ), Disp: 30 tablet, Rfl: 0 Allergies: Codeine, Ibuprofen, and Tape  Review of Systems  Constitutional: Negative for chills, fever and malaise/fatigue.  HENT: Negative for congestion, sinus pain and sore throat.   Eyes: Negative for blurred vision and pain.  Respiratory: Negative for cough and wheezing.   Cardiovascular: Negative for chest pain and leg swelling.  Gastrointestinal: Negative for abdominal pain, constipation, diarrhea, heartburn, nausea and vomiting.  Genitourinary: Negative for dysuria, frequency, hematuria and urgency.  Musculoskeletal: Negative for back pain, joint pain, myalgias and neck pain.  Skin: Negative for itching and rash.  Neurological: Negative for dizziness, tremors and weakness.  Endo/Heme/Allergies: Does not bruise/bleed easily.  Psychiatric/Behavioral: Negative for depression. The patient is not nervous/anxious and does not have insomnia.     Objective: BP 120/80   Ht 5\' 7"  (1.702 m)   Wt 265 lb (120.2 kg)   LMP 02/16/2019   BMI 41.50 kg/m   Filed Weights   02/28/19 1028  Weight: 265 lb (120.2 kg)   Physical Exam Constitutional:      General: She is not in acute distress.    Appearance: She  is well-developed.  HENT:     Head: Normocephalic and atraumatic. No laceration.     Right Ear: Hearing normal.     Left Ear: Hearing normal.     Mouth/Throat:     Pharynx: Uvula midline.  Eyes:     Pupils: Pupils are equal, round, and reactive to light.  Neck:     Thyroid: No thyromegaly.  Cardiovascular:     Rate and Rhythm: Normal rate and regular rhythm.     Heart sounds: No murmur. No friction rub. No gallop.   Pulmonary:     Effort: Pulmonary effort is normal. No respiratory distress.      Breath sounds: Normal breath sounds. No wheezing.  Chest:     Breasts:        Right: No mass, skin change or tenderness.        Left: No mass, skin change or tenderness.  Abdominal:     General: Bowel sounds are normal. There is no distension.     Palpations: Abdomen is soft.     Tenderness: There is no abdominal tenderness. There is no rebound.  Musculoskeletal:        General: Normal range of motion.     Cervical back: Normal range of motion and neck supple.  Neurological:     Mental Status: She is alert and oriented to person, place, and time.     Cranial Nerves: No cranial nerve deficit.  Skin:    General: Skin is warm and dry.  Psychiatric:        Judgment: Judgment normal.  Vitals reviewed.     Assessment: 1. Dermoid cyst of left ovary   2. LLQ abdominal pain   Options for laparoscopy and cystectomy vs oophorectomy (left) and she prefers oophorectomy due to its rapid recurrent status; also reports family history of recurrent dermoid cysts.  I have had a careful discussion with this patient about all the options available and the risk/benefits of each. I have fully informed this patient that surgery may subject her to a variety of discomforts and risks: She understands that most patients have surgery with little difficulty, but problems can happen ranging from minor to fatal. These include nausea, vomiting, pain, bleeding, infection, poor healing, hernia, or formation of adhesions. Unexpected reactions may occur from any drug or anesthetic given. Unintended injury may occur to other pelvic or abdominal structures such as Fallopian tubes, ovaries, bladder, ureter (tube from kidney to bladder), or bowel. Nerves going from the pelvis to the legs may be injured. Any such injury may require immediate or later additional surgery to correct the problem. Excessive blood loss requiring transfusion is very unlikely but possible. Dangerous blood clots may form in the legs or lungs. Physical and  sexual activity will be restricted in varying degrees for an indeterminate period of time but most often 2-6 weeks.  Finally, she understands that it is impossible to list every possible undesirable effect and that the condition for which surgery is done is not always cured or significantly improved, and in rare cases may be even worse.Ample time was given to answer all questions.  Barnett Applebaum, MD, [name], [facility] 02/28/2019  10:50 AM

## 2019-02-28 NOTE — H&P (View-Only) (Signed)
PRE-OPERATIVE HISTORY AND PHYSICAL EXAM  HPI:  Ashley Moyer is a 29 y.o. G2P1001 Patient's last menstrual period was 02/16/2019.; she is being admitted for surgery related to recurrent left sided ovarian dermoid cyst, with associated pelvic pain.  Pain began RLQ in Nov 2020, then became more diffuse then left sided.  No radiation, associated findings, modifiers, context.  CT and [place] in Nov suggested left sided 4 cm dermoid.  Previous evaluation: treated for cyst by lap and cystectomy that resulted in dermoid last summer.  Prior Diagnosis: Mature cystic teratoma.  PMHx: Past Medical History:  Diagnosis Date  . Arthritis    KNEE  . Asthma    WELL CONTROLLED  . Bipolar 1 disorder ([place])   . Depression   . [id number])    Past Surgical History:  Procedure Laterality Date  . CHOLECYSTECTOMY    . LAPAROSCOPIC OVARIAN CYSTECTOMY N/A 08/31/2018   Procedure: LAPAROSCOPIC OVARIAN CYSTECTOMY;  Surgeon: Gae Dry, MD;  Location: [facility] ORS;  Service: Gynecology;  Laterality: N/A;  . WISDOM TOOTH EXTRACTION     Family History  Problem Relation Age of Onset  . Cancer Mother   . Cancer Maternal Aunt   . Cancer Maternal Grandmother    Social History   Tobacco Use  . Smoking status: Current Every Day Smoker    Packs/day: 0.25    Years: 9.00    Pack years: 2.25    Types: Cigarettes  . Smokeless tobacco: Never Used  Substance Use Topics  . Alcohol use: Not Currently    Comment: rarely  . Drug use: No    Current Outpatient Medications:  .  albuterol (PROVENTIL HFA;VENTOLIN HFA) 108 (90 BASE) MCG/ACT inhaler, Inhale 2 puffs into the lungs every 6 (six) hours as needed for wheezing or shortness of breath., Disp: , Rfl:  .  aspirin-acetaminophen-caffeine (EXCEDRIN MIGRAINE) 250-250-65 MG tablet, Take 2 tablets by mouth every 6 (six) hours as needed for headache., Disp: , Rfl:  .  bismuth subsalicylate (PEPTO BISMOL) 262 MG/15ML suspension, Take 30 mLs by mouth every 6 (six)  hours as needed for indigestion., Disp: , Rfl:  .  traMADol (ULTRAM) 50 MG tablet, Take 1 tablet (50 mg total) by mouth every 12 (twelve) hours as needed. (Patient taking differently: Take 50 mg by mouth every 12 (twelve) hours as needed for moderate pain. ), Disp: 30 tablet, Rfl: 0 Allergies: Codeine, Ibuprofen, and Tape  Review of Systems  Constitutional: Negative for chills, fever and malaise/fatigue.  HENT: Negative for congestion, sinus pain and sore throat.   Eyes: Negative for blurred vision and pain.  Respiratory: Negative for cough and wheezing.   Cardiovascular: Negative for chest pain and leg swelling.  Gastrointestinal: Negative for abdominal pain, constipation, diarrhea, heartburn, nausea and vomiting.  Genitourinary: Negative for dysuria, frequency, hematuria and urgency.  Musculoskeletal: Negative for back pain, joint pain, myalgias and neck pain.  Skin: Negative for itching and rash.  Neurological: Negative for dizziness, tremors and weakness.  Endo/Heme/Allergies: Does not bruise/bleed easily.  Psychiatric/Behavioral: Negative for depression. The patient is not nervous/anxious and does not have insomnia.     Objective: BP 120/80   Ht 5\' 7"  (1.702 m)   Wt 265 lb (120.2 kg)   LMP 02/16/2019   BMI 41.50 kg/m   Filed Weights   02/28/19 1028  Weight: 265 lb (120.2 kg)   Physical Exam Constitutional:      General: She is not in acute distress.    Appearance: She  is well-developed.  HENT:     Head: Normocephalic and atraumatic. No laceration.     Right Ear: Hearing normal.     Left Ear: Hearing normal.     Mouth/Throat:     Pharynx: Uvula midline.  Eyes:     Pupils: Pupils are equal, round, and reactive to light.  Neck:     Thyroid: No thyromegaly.  Cardiovascular:     Rate and Rhythm: Normal rate and regular rhythm.     Heart sounds: No murmur. No friction rub. No gallop.   Pulmonary:     Effort: Pulmonary effort is normal. No respiratory distress.      Breath sounds: Normal breath sounds. No wheezing.  Chest:     Breasts:        Right: No mass, skin change or tenderness.        Left: No mass, skin change or tenderness.  Abdominal:     General: Bowel sounds are normal. There is no distension.     Palpations: Abdomen is soft.     Tenderness: There is no abdominal tenderness. There is no rebound.  Musculoskeletal:        General: Normal range of motion.     Cervical back: Normal range of motion and neck supple.  Neurological:     Mental Status: She is alert and oriented to person, place, and time.     Cranial Nerves: No cranial nerve deficit.  Skin:    General: Skin is warm and dry.  Psychiatric:        Judgment: Judgment normal.  Vitals reviewed.     Assessment: 1. Dermoid cyst of left ovary   2. LLQ abdominal pain   Options for laparoscopy and cystectomy vs oophorectomy (left) and she prefers oophorectomy due to its rapid recurrent status; also reports family history of recurrent dermoid cysts.  I have had a careful discussion with this patient about all the options available and the risk/benefits of each. I have fully informed this patient that surgery may subject her to a variety of discomforts and risks: She understands that most patients have surgery with little difficulty, but problems can happen ranging from minor to fatal. These include nausea, vomiting, pain, bleeding, infection, poor healing, hernia, or formation of adhesions. Unexpected reactions may occur from any drug or anesthetic given. Unintended injury may occur to other pelvic or abdominal structures such as Fallopian tubes, ovaries, bladder, ureter (tube from kidney to bladder), or bowel. Nerves going from the pelvis to the legs may be injured. Any such injury may require immediate or later additional surgery to correct the problem. Excessive blood loss requiring transfusion is very unlikely but possible. Dangerous blood clots may form in the legs or lungs. Physical and  sexual activity will be restricted in varying degrees for an indeterminate period of time but most often 2-6 weeks.  Finally, she understands that it is impossible to list every possible undesirable effect and that the condition for which surgery is done is not always cured or significantly improved, and in rare cases may be even worse.Ample time was given to answer all questions.  Barnett Applebaum, MD, [name], [facility] 02/28/2019  10:50 AM

## 2019-02-28 NOTE — Patient Instructions (Signed)
PRE ADMISSION TESTING °For Covid, prior to procedure °Wednesday 9:00-10:00 °[facility] entrance (drive up) ° °Results in 48-72 hours °You will not receive notification if test results are negative. °If positive for Covid19, your provider will notify you by phone, with additional instructions. ° ° ° °Ovarian Cystectomy, Care After °This sheet gives you information about how to care for yourself after your procedure. Your health care provider may also give you more specific instructions. If you have problems or questions, contact your health care provider. °What can I expect after the procedure? °After the procedure, it is common to have: °· Pain in the abdomen, especially at the incision areas. You will be given pain medicines to control the pain. °· Tiredness. This is a normal part of the recovery process. Your energy level will return to normal over the next several weeks. °Follow these instructions at home: °Medicines °· Take over-the-counter and prescription medicines only as told by your health care provider. °· If you were prescribed an antibiotic medicine, use it as told by your health care provider. Do not stop using the antibiotic even if you start to feel better. °· Do not take aspirin because it can cause bleeding. °· Ask your health care provider if the medicine prescribed to you: °? Requires you to avoid driving or using heavy machinery. °? Can cause constipation. You may need to take these actions to prevent or treat constipation: °§ Drink enough fluid to keep your urine pale yellow. °§ Take over-the-counter or prescription medicines. °§ Eat foods that are high in fiber, such as beans, whole grains, and fresh fruits and vegetables. °§ Limit foods that are high in fat and processed sugars, such as fried or sweet foods. °Incision care ° °· Follow instructions from your health care provider about how to take care of your incisions. Make sure you: °? Wash your hands with soap and water before and  after you change your bandage (dressing). If soap and water are not available, use hand sanitizer. °? Change your dressing as told by your health care provider. °? Leave stitches (sutures), skin glue, or adhesive strips in place. These skin closures may need to stay in place for 2 weeks or longer. If adhesive strip edges start to loosen and curl up, you may trim the loose edges. Do not remove adhesive strips completely unless your health care provider tells you to do that. °· Check your incision areas every day for signs of infection. Check for: °? Redness, swelling, or pain. °? Fluid or blood. °? Warmth. °? Pus or a bad smell. °· Do not take baths, swim, or use a hot tub until your health care provider approves. Ask your health care provider if you may take showers. You may only be allowed to take sponge baths. °Activity °· Rest as told by your health care provider. °· Avoid sitting for a long time without moving. Get up to take short walks every 1-2 hours. This is important to improve blood flow and breathing. Ask for help if you feel weak or unsteady. °· Do not lift anything that is heavier than 10 lb (4.5 kg), or the limit that you are told, until your health care provider says that it is safe. °· Return to your normal activities and diet as told by your health care provider. Ask your health care provider what activities are safe for you. °General instructions °· Do not douche, use tampons, or have sexual intercourse until your health care provider says it is   okay to do so. °· Do not use any products that contain nicotine or tobacco, such as cigarettes, e-cigarettes, and chewing tobacco. These can delay incision healing after surgery. If you need help quitting, ask your health care provider. °· Keep all follow-up visits as told by your health care provider. This is important. °Contact a health care provider if: °· You have a fever. °· You feel nauseous or you vomit. °· You have pain when you urinate or have  blood in your urine. °· You have a rash on your body. °· You have pain or redness where the IV was inserted. °· You have pain that is not relieved with medicine. °· You have any of these signs of infection: °? Redness, swelling, or pain around your incisions. °? Fluid or blood coming from your incisions. °? Warmth coming from an incision. °? Pus or a bad smell coming from your incisions. °Get help right away if: °· You have chest pain or shortness of breath. °· You feel dizzy or light-headed. °· You have heavy bleeding. °· You have increasing abdominal pain that is not relieved with medicines. °· You have pain, swelling, or redness in your leg. °· Your incision is opening (the edges are not staying together). °Summary °· After the procedure, it is common to have some pain in your abdomen. You will be given pain medicines to control the pain. °· Follow instructions from your health care provider about how to take care of your incisions. °· Do not douche, use tampons, or have sexual intercourse until your health care provider says it is okay to do so. °· Keep all follow-up visits as told by your health care provider. This is important. °This information is not intended to replace advice given to you by your health care provider. Make sure you discuss any questions you have with your health care provider. °Document Revised: 08/26/2018 Document Reviewed: 08/26/2018 °Elsevier Patient Education © 2020 [employer] ° °

## 2019-03-01 ENCOUNTER — Other Ambulatory Visit
Admission: RE | Admit: 2019-03-01 | Discharge: 2019-03-01 | Disposition: A | Discharge status: Home / Self Care | Payer: Medicaid Other | Source: Ambulatory Visit | Attending: Obstetrics & Gynecology | Admitting: Obstetrics & Gynecology

## 2019-03-01 ENCOUNTER — Encounter
Admission: RE | Admit: 2019-03-01 | Discharge: 2019-03-01 | Disposition: A | Discharge status: Home / Self Care | Payer: Medicaid Other | Source: Ambulatory Visit | Attending: Obstetrics & Gynecology | Admitting: Obstetrics & Gynecology

## 2019-03-01 DIAGNOSIS — Z01812 Encounter for preprocedural laboratory examination: Secondary | ICD-10-CM | POA: Diagnosis not present

## 2019-03-01 DIAGNOSIS — Z20822 Contact with and (suspected) exposure to covid-19: Secondary | ICD-10-CM | POA: Diagnosis not present

## 2019-03-01 HISTORY — DX: Borderline personality disorder: F60.3

## 2019-03-01 HISTORY — DX: Post-traumatic stress disorder, unspecified: F43.10

## 2019-03-01 LAB — SARS CORONAVIRUS 2 (TAT 6-24 HRS): SARS Coronavirus 2: NEGATIVE

## 2019-03-01 LAB — CBC
HCT: 43.8 % (ref 36.0–46.0)
Hemoglobin: 14.5 g/dL (ref 12.0–15.0)
MCH: 31.7 pg (ref 26.0–34.0)
MCHC: 33.1 g/dL (ref 30.0–36.0)
MCV: 95.8 fL (ref 80.0–100.0)
Platelets: 285 10*3/uL (ref 150–400)
RBC: 4.57 MIL/uL (ref 3.87–5.11)
RDW: 12.6 % (ref 11.5–15.5)
WBC: 6.4 10*3/uL (ref 4.0–10.5)
nRBC: 0 % (ref 0.0–0.2)

## 2019-03-01 LAB — TYPE AND SCREEN
ABO/RH(D): A POS
Antibody Screen: NEGATIVE

## 2019-03-01 NOTE — Patient Instructions (Addendum)
Your procedure is scheduled on: Friday 1/21 Report to Day Surgery. To find out your arrival time please call [phone] between 1PM - 3PM on thurs  1/20.  Remember: Instructions that are not followed completely may result in serious medical risk,  up to and including death, or upon the discretion of your surgeon and anesthesiologist your  surgery may need to be rescheduled.     _X__ 1. Do not eat food after midnight the night before your procedure.                 No gum chewing or hard candies. You may drink clear liquids up to 2 hours                 before you are scheduled to arrive for your surgery- DO not drink clear                 liquids within 2 hours of the start of your surgery.                 Clear Liquids include:  water, apple juice without pulp, clear carbohydrate                 drink such as Clearfast of Gatorade, Black Coffee or Tea (Do not add                 anything to coffee or tea).  __X__2.  On the morning of surgery brush your teeth with toothpaste and water, you                may rinse your mouth with mouthwash if you wish.  Do not swallow any toothpaste of mouthwash.     _X__ 3.  No Alcohol for 24 hours before or after surgery.   _X__ 4.  Do Not Smoke or use e-cigarettes For 24 Hours Prior to Your Surgery.                 Do not use any chewable tobacco products for at least 6 hours prior to                 surgery.  ____  5.  Bring all medications with you on the day of surgery if instructed.   ___x_  6.  Notify your doctor if there is any change in your medical condition      (cold, fever, infections).     Do not wear jewelry, make-up, hairpins, clips or nail polish. Do not wear lotions, powders, or perfumes. You may wear deodorant. Do not shave 48 hours prior to surgery. Men may shave face and neck. Do not bring valuables to the hospital.    [facility] is not responsible for any belongings or valuables.  Contacts,  dentures or bridgework may not be worn into surgery. Leave your suitcase in the car. After surgery it may be brought to your room. For patients admitted to the hospital, discharge time is determined by your treatment team.   Patients discharged the day of surgery will not be allowed to drive home.   Please read over the following fact sheets that you were given:    __x__ Take these medicines the morning of surgery with A SIP OF WATER:    1. traMADol (ULTRAM) 50 MG tablet is needed  2.   3.   4.  5.  6.  ____ Fleet Enema (as directed)   __x__ Use CHG Soap as directed  __x__ Use inhalers on the day of surgery albuterol (PROVENTIL HFA;VENTOLIN HFA) 108 (90 BASE) MCG/ACT inhaler and bring with you   ____ Stop metformin 2 days prior to surgery    ____ Take 1/2 of usual insulin dose the night before surgery. No insulin the morning          of surgery.   ____ Stop Coumadin/Plavix/aspirin on   __x__ Stop Anti-inflammatories no ibuprofen aleve of aspirin until after the surgery     May take tylenol   ____ Stop supplements until after surgery.    ____ Bring C-Pap to the hospital.

## 2019-03-02 ENCOUNTER — Other Ambulatory Visit: Payer: Medicaid Other

## 2019-03-03 ENCOUNTER — Ambulatory Visit
Admission: RE | Admit: 2019-03-03 | Discharge: 2019-03-03 | Disposition: A | Discharge status: Home / Self Care | Payer: Medicaid Other | Attending: Obstetrics & Gynecology | Admitting: Obstetrics & Gynecology

## 2019-03-03 ENCOUNTER — Encounter
Admission: RE | Disposition: A | Discharge status: Home / Self Care | Payer: Self-pay | Source: Home / Self Care | Attending: Obstetrics & Gynecology

## 2019-03-03 ENCOUNTER — Other Ambulatory Visit: Payer: Self-pay | Admitting: Obstetrics & Gynecology

## 2019-03-03 ENCOUNTER — Other Ambulatory Visit: Payer: Self-pay

## 2019-03-03 ENCOUNTER — Ambulatory Visit: Payer: Medicaid Other | Admitting: Anesthesiology

## 2019-03-03 ENCOUNTER — Encounter: Payer: Self-pay | Admitting: Obstetrics & Gynecology

## 2019-03-03 DIAGNOSIS — M1710 Unilateral primary osteoarthritis, unspecified knee: Secondary | ICD-10-CM | POA: Diagnosis not present

## 2019-03-03 DIAGNOSIS — Z888 Allergy status to other drugs, medicaments and biological substances status: Secondary | ICD-10-CM | POA: Insufficient documentation

## 2019-03-03 DIAGNOSIS — Z886 Allergy status to analgesic agent status: Secondary | ICD-10-CM | POA: Insufficient documentation

## 2019-03-03 DIAGNOSIS — R102 Pelvic and perineal pain: Secondary | ICD-10-CM | POA: Diagnosis not present

## 2019-03-03 DIAGNOSIS — F17210 Nicotine dependence, cigarettes, uncomplicated: Secondary | ICD-10-CM | POA: Insufficient documentation

## 2019-03-03 DIAGNOSIS — Z885 Allergy status to narcotic agent status: Secondary | ICD-10-CM | POA: Insufficient documentation

## 2019-03-03 DIAGNOSIS — J45909 Unspecified asthma, uncomplicated: Secondary | ICD-10-CM | POA: Diagnosis not present

## 2019-03-03 DIAGNOSIS — Z302 Encounter for sterilization: Secondary | ICD-10-CM | POA: Diagnosis not present

## 2019-03-03 DIAGNOSIS — F319 Bipolar disorder, unspecified: Secondary | ICD-10-CM | POA: Diagnosis not present

## 2019-03-03 DIAGNOSIS — D271 Benign neoplasm of left ovary: Secondary | ICD-10-CM | POA: Diagnosis not present

## 2019-03-03 LAB — POCT PREGNANCY, URINE: Preg Test, Ur: NEGATIVE

## 2019-03-03 SURGERY — OOPHORECTOMY, LAPAROSCOPIC
Anesthesia: General | Laterality: Left

## 2019-03-03 MED ORDER — ATROPINE SULFATE 0.4 MG/ML IJ SOLN
INTRAMUSCULAR | Status: DC | PRN
  Administered 2019-03-03: .2 mg via INTRAVENOUS

## 2019-03-03 MED ORDER — FAMOTIDINE 20 MG PO TABS: 20.0000 mg | ORAL_TABLET | Freq: Once | ORAL | Status: AC

## 2019-03-03 MED ORDER — FENTANYL CITRATE (PF) 100 MCG/2ML IJ SOLN
INTRAMUSCULAR | Status: AC
  Administered 2019-03-03: 25 ug via INTRAVENOUS
  Filled 2019-03-03: qty 2

## 2019-03-03 MED ORDER — DEXMEDETOMIDINE HCL 200 MCG/2ML IV SOLN
INTRAVENOUS | Status: DC | PRN
  Administered 2019-03-03: 12 ug via INTRAVENOUS

## 2019-03-03 MED ORDER — BUPIVACAINE HCL (PF) 0.5 % IJ SOLN
INTRAMUSCULAR | Status: DC | PRN
  Administered 2019-03-03: 16 mL

## 2019-03-03 MED ORDER — FAMOTIDINE 20 MG PO TABS
ORAL_TABLET | ORAL | Status: AC
  Administered 2019-03-03: 20 mg via ORAL
  Filled 2019-03-03: qty 1

## 2019-03-03 MED ORDER — ONDANSETRON HCL 4 MG/2ML IJ SOLN: 4.0000 mg | Freq: Once | INTRAMUSCULAR | Status: DC | PRN

## 2019-03-03 MED ORDER — ROCURONIUM BROMIDE 50 MG/5ML IV SOLN
INTRAVENOUS | Status: AC
  Filled 2019-03-03: qty 1

## 2019-03-03 MED ORDER — PROPOFOL 500 MG/50ML IV EMUL
INTRAVENOUS | Status: AC
  Filled 2019-03-03: qty 50

## 2019-03-03 MED ORDER — LACTATED RINGERS IV SOLN: INTRAVENOUS | Status: DC | PRN

## 2019-03-03 MED ORDER — ACETAMINOPHEN NICU IV SYRINGE 10 MG/ML
INTRAVENOUS | Status: AC
  Filled 2019-03-03: qty 1

## 2019-03-03 MED ORDER — ONDANSETRON HCL 4 MG/2ML IJ SOLN
INTRAMUSCULAR | Status: DC | PRN
  Administered 2019-03-03: 4 mg via INTRAVENOUS

## 2019-03-03 MED ORDER — FENTANYL CITRATE (PF) 100 MCG/2ML IJ SOLN
INTRAMUSCULAR | Status: DC | PRN
  Administered 2019-03-03 (×2): 50 ug via INTRAVENOUS

## 2019-03-03 MED ORDER — LACTATED RINGERS IV BOLUS
500.0000 mL | Freq: Once | INTRAVENOUS | Status: AC
  Administered 2019-03-03: 500 mL via INTRAVENOUS

## 2019-03-03 MED ORDER — SUCCINYLCHOLINE CHLORIDE 20 MG/ML IJ SOLN
INTRAMUSCULAR | Status: AC
  Filled 2019-03-03: qty 1

## 2019-03-03 MED ORDER — FENTANYL CITRATE (PF) 100 MCG/2ML IJ SOLN
INTRAMUSCULAR | Status: AC
  Filled 2019-03-03: qty 2

## 2019-03-03 MED ORDER — ROCURONIUM BROMIDE 100 MG/10ML IV SOLN
INTRAVENOUS | Status: DC | PRN
  Administered 2019-03-03: 40 mg via INTRAVENOUS
  Administered 2019-03-03: 10 mg via INTRAVENOUS

## 2019-03-03 MED ORDER — ATROPINE SULFATE 0.4 MG/ML IJ SOLN
INTRAMUSCULAR | Status: AC
  Filled 2019-03-03: qty 1

## 2019-03-03 MED ORDER — MIDAZOLAM HCL 2 MG/2ML IJ SOLN
INTRAMUSCULAR | Status: DC | PRN
  Administered 2019-03-03: 2 mg via INTRAVENOUS

## 2019-03-03 MED ORDER — ACETAMINOPHEN 10 MG/ML IV SOLN
INTRAVENOUS | Status: DC | PRN
  Administered 2019-03-03: 1000 mg via INTRAVENOUS

## 2019-03-03 MED ORDER — ACETAMINOPHEN 650 MG RE SUPP
650.0000 mg | RECTAL | Status: DC | PRN
  Filled 2019-03-03: qty 1

## 2019-03-03 MED ORDER — LACTATED RINGERS IV SOLN: INTRAVENOUS | Status: DC

## 2019-03-03 MED ORDER — FENTANYL CITRATE (PF) 100 MCG/2ML IJ SOLN
25.0000 ug | INTRAMUSCULAR | Status: AC | PRN
  Administered 2019-03-03 (×6): 25 ug via INTRAVENOUS

## 2019-03-03 MED ORDER — OXYCODONE-ACETAMINOPHEN 5-325 MG PO TABS
1.0000 | ORAL_TABLET | ORAL | Status: DC | PRN
  Administered 2019-03-03: 1 via ORAL

## 2019-03-03 MED ORDER — OXYCODONE-ACETAMINOPHEN 5-325 MG PO TABS: 1.0000 | ORAL_TABLET | ORAL | 0 refills | Status: DC | PRN

## 2019-03-03 MED ORDER — OXYCODONE-ACETAMINOPHEN 5-325 MG PO TABS
ORAL_TABLET | ORAL | Status: AC
  Filled 2019-03-03: qty 1

## 2019-03-03 MED ORDER — SUGAMMADEX SODIUM 200 MG/2ML IV SOLN
INTRAVENOUS | Status: DC | PRN
  Administered 2019-03-03: 240 mg via INTRAVENOUS

## 2019-03-03 MED ORDER — TRAMADOL HCL 50 MG PO TABS: 50.0000 mg | ORAL_TABLET | Freq: Four times a day (QID) | ORAL | 0 refills | Status: DC | PRN

## 2019-03-03 MED ORDER — DEXAMETHASONE SODIUM PHOSPHATE 10 MG/ML IJ SOLN
INTRAMUSCULAR | Status: AC
  Filled 2019-03-03: qty 1

## 2019-03-03 MED ORDER — MIDAZOLAM HCL 2 MG/2ML IJ SOLN
INTRAMUSCULAR | Status: AC
  Filled 2019-03-03: qty 2

## 2019-03-03 MED ORDER — LIDOCAINE HCL (CARDIAC) PF 100 MG/5ML IV SOSY
PREFILLED_SYRINGE | INTRAVENOUS | Status: DC | PRN
  Administered 2019-03-03: 100 mg via INTRAVENOUS

## 2019-03-03 MED ORDER — MORPHINE SULFATE (PF) 4 MG/ML IV SOLN: 1.0000 mg | INTRAVENOUS | Status: DC | PRN

## 2019-03-03 MED ORDER — BUPIVACAINE HCL (PF) 0.5 % IJ SOLN
INTRAMUSCULAR | Status: AC
  Filled 2019-03-03: qty 30

## 2019-03-03 MED ORDER — ACETAMINOPHEN 325 MG PO TABS: 650.0000 mg | ORAL_TABLET | ORAL | Status: DC | PRN

## 2019-03-03 MED ORDER — KETOROLAC TROMETHAMINE 30 MG/ML IJ SOLN
INTRAMUSCULAR | Status: DC | PRN
  Administered 2019-03-03: 30 mg via INTRAVENOUS

## 2019-03-03 MED ORDER — PROPOFOL 10 MG/ML IV BOLUS
INTRAVENOUS | Status: DC | PRN
  Administered 2019-03-03: 140 mg via INTRAVENOUS

## 2019-03-03 MED ORDER — PHENYLEPHRINE HCL (PRESSORS) 10 MG/ML IV SOLN
INTRAVENOUS | Status: DC | PRN
  Administered 2019-03-03 (×2): 100 ug via INTRAVENOUS
  Administered 2019-03-03: 200 ug via INTRAVENOUS
  Administered 2019-03-03 (×2): 100 ug via INTRAVENOUS

## 2019-03-03 MED ORDER — DEXAMETHASONE SODIUM PHOSPHATE 10 MG/ML IJ SOLN
INTRAMUSCULAR | Status: DC | PRN
  Administered 2019-03-03: 10 mg via INTRAVENOUS

## 2019-03-03 MED ORDER — ONDANSETRON HCL 4 MG/2ML IJ SOLN
INTRAMUSCULAR | Status: AC
  Filled 2019-03-03: qty 2

## 2019-03-03 SURGICAL SUPPLY — 39 items
BLADE SURG SZ11 CARB STEEL (BLADE) ×2 IMPLANT
CANISTER SUCT 1200ML W/VALVE (MISCELLANEOUS) ×2 IMPLANT
CATH ROBINSON RED A/P 16FR (CATHETERS) ×2 IMPLANT
CHLORAPREP W/TINT 26 (MISCELLANEOUS) ×2 IMPLANT
COVER WAND RF STERILE (DRAPES) IMPLANT
DERMABOND ADVANCED (GAUZE/BANDAGES/DRESSINGS) ×1
DERMABOND ADVANCED .7 DNX12 (GAUZE/BANDAGES/DRESSINGS) ×1 IMPLANT
DRSG TELFA 4X3 1S NADH ST (GAUZE/BANDAGES/DRESSINGS) IMPLANT
GLOVE BIO SURGEON STRL SZ8 (GLOVE) ×14 IMPLANT
GLOVE INDICATOR 8.0 STRL GRN (GLOVE) ×2 IMPLANT
GOWN STRL REUS W/ TWL LRG LVL3 (GOWN DISPOSABLE) ×1 IMPLANT
GOWN STRL REUS W/ TWL XL LVL3 (GOWN DISPOSABLE) ×1 IMPLANT
GOWN STRL REUS W/TWL LRG LVL3 (GOWN DISPOSABLE) ×1
GOWN STRL REUS W/TWL XL LVL3 (GOWN DISPOSABLE) ×1
GRASPER SUT TROCAR 14GX15 (MISCELLANEOUS) ×2 IMPLANT
IRRIGATION STRYKERFLOW (MISCELLANEOUS) IMPLANT
IRRIGATOR STRYKERFLOW (MISCELLANEOUS)
IV LACTATED RINGERS 1000ML (IV SOLUTION) IMPLANT
KIT PINK PAD W/HEAD ARE REST (MISCELLANEOUS) ×2
KIT PINK PAD W/HEAD ARM REST (MISCELLANEOUS) ×1 IMPLANT
LABEL OR SOLS (LABEL) ×2 IMPLANT
NEEDLE VERESS 14GA 120MM (NEEDLE) ×2 IMPLANT
NS IRRIG 500ML POUR BTL (IV SOLUTION) ×2 IMPLANT
PACK GYN LAPAROSCOPIC (MISCELLANEOUS) ×2 IMPLANT
PAD PREP 24X41 OB/GYN DISP (PERSONAL CARE ITEMS) ×2 IMPLANT
POUCH SPECIMEN RETRIEVAL 10MM (ENDOMECHANICALS) ×2 IMPLANT
SCISSORS METZENBAUM CVD 33 (INSTRUMENTS) IMPLANT
SET TUBE SMOKE EVAC HIGH FLOW (TUBING) ×2 IMPLANT
SHEARS HARMONIC ACE PLUS 36CM (ENDOMECHANICALS) ×2 IMPLANT
SLEEVE ENDOPATH XCEL 5M (ENDOMECHANICALS) ×2 IMPLANT
SPONGE GAUZE 2X2 8PLY STRL LF (GAUZE/BANDAGES/DRESSINGS) IMPLANT
STRAP SAFETY 5IN WIDE (MISCELLANEOUS) ×2 IMPLANT
SUT VIC AB 0 CT1 36 (SUTURE) ×2 IMPLANT
SUT VIC AB 2-0 UR6 27 (SUTURE) IMPLANT
SUT VIC AB 4-0 PS2 18 (SUTURE) ×2 IMPLANT
SYR 10ML LL (SYRINGE) ×2 IMPLANT
SYSTEM WECK SHIELD CLOSURE (TROCAR) IMPLANT
TROCAR ENDO BLADELESS 11MM (ENDOMECHANICALS) ×2 IMPLANT
TROCAR XCEL NON-BLD 5MMX100MML (ENDOMECHANICALS) ×2 IMPLANT

## 2019-03-03 NOTE — Discharge Instructions (Addendum)
AMBULATORY SURGERY  DISCHARGE INSTRUCTIONS   1) The drugs that you were given will stay in your system until tomorrow so for the next 24 hours you should not:  A) Drive an automobile B) Make any legal decisions C) Drink any alcoholic beverage   2) You may resume regular meals tomorrow.  Today it is better to start with liquids and gradually work up to solid foods.  You may eat anything you prefer, but it is better to start with liquids, then soup and crackers, and gradually work up to solid foods.   3) Please notify your doctor immediately if you have any unusual bleeding, trouble breathing, redness and pain at the surgery site, drainage, fever, or pain not relieved by medication.    4) Additional Instructions:        Please contact your physician with any problems or Same Day Surgery at [phone], Monday through Friday 6 am to 4 pm, or [place] at [facility] number at [phone].   Ovarian Cystectomy, Care After This sheet gives you information about how to care for yourself after your procedure. Your health care provider may also give you more specific instructions. If you have problems or questions, contact your health care provider. What can I expect after the procedure? After the procedure, it is common to have:  Pain in the abdomen, especially at the incision areas. You will be given pain medicines to control the pain.  Tiredness. This is a normal part of the recovery process. Your energy level will return to normal over the next several weeks. Follow these instructions at home: Medicines  Take over-the-counter and prescription medicines only as told by your health care provider.  If you were prescribed an antibiotic medicine, use it as told by your health care provider. Do not stop using the antibiotic even if you start to feel better.  Do not take aspirin because it can cause bleeding.  Ask your health care provider if the medicine prescribed to  you: ? Requires you to avoid driving or using heavy machinery. ? Can cause constipation. You may need to take these actions to prevent or treat constipation:  Drink enough fluid to keep your urine pale yellow.  Take over-the-counter or prescription medicines.  Eat foods that are high in fiber, such as beans, whole grains, and fresh fruits and vegetables.  Limit foods that are high in fat and processed sugars, such as fried or sweet foods. Incision care   Follow instructions from your health care provider about how to take care of your incisions. Make sure you: ? Wash your hands with soap and water before and after you change your bandage (dressing). If soap and water are not available, use hand sanitizer. ? Change your dressing as told by your health care provider. ? Leave stitches (sutures), skin glue, or adhesive strips in place. These skin closures may need to stay in place for 2 weeks or longer. If adhesive strip edges start to loosen and curl up, you may trim the loose edges. Do not remove adhesive strips completely unless your health care provider tells you to do that.  Check your incision areas every day for signs of infection. Check for: ? Redness, swelling, or pain. ? Fluid or blood. ? Warmth. ? Pus or a bad smell.  Do not take baths, swim, or use a hot tub until your health care provider approves. Ask your health care provider if you may take showers. You may only be allowed to take  sponge baths. Activity  Rest as told by your health care provider.  Avoid sitting for a long time without moving. Get up to take short walks every 1-2 hours. This is important to improve blood flow and breathing. Ask for help if you feel weak or unsteady.  Do not lift anything that is heavier than 10 lb (4.5 kg), or the limit that you are told, until your health care provider says that it is safe.  Return to your normal activities and diet as told by your health care provider. Ask your health  care provider what activities are safe for you. General instructions  Do not douche, use tampons, or have sexual intercourse until your health care provider says it is okay to do so.  Do not use any products that contain nicotine or tobacco, such as cigarettes, e-cigarettes, and chewing tobacco. These can delay incision healing after surgery. If you need help quitting, ask your health care provider.  Keep all follow-up visits as told by your health care provider. This is important. Contact a health care provider if:  You have a fever.  You feel nauseous or you vomit.  You have pain when you urinate or have blood in your urine.  You have a rash on your body.  You have pain or redness where the IV was inserted.  You have pain that is not relieved with medicine.  You have any of these signs of infection: ? Redness, swelling, or pain around your incisions. ? Fluid or blood coming from your incisions. ? Warmth coming from an incision. ? Pus or a bad smell coming from your incisions. Get help right away if:  You have chest pain or shortness of breath.  You feel dizzy or light-headed.  You have heavy bleeding.  You have increasing abdominal pain that is not relieved with medicines.  You have pain, swelling, or redness in your leg.  Your incision is opening (the edges are not staying together). Summary  After the procedure, it is common to have some pain in your abdomen. You will be given pain medicines to control the pain.  Follow instructions from your health care provider about how to take care of your incisions.  Do not douche, use tampons, or have sexual intercourse until your health care provider says it is okay to do so.  Keep all follow-up visits as told by your health care provider. This is important. This information is not intended to replace advice given to you by your health care provider. Make sure you discuss any questions you have with your health care  provider. Document Revised: 08/26/2018 Document Reviewed: 08/26/2018 Elsevier Patient Education  [place]   5) The drugs that you were given will stay in your system until tomorrow so for the next 24 hours you should not:  D) Drive an automobile E) Make any legal decisions F) Drink any alcoholic beverage   6) You may resume regular meals tomorrow.  Today it is better to start with liquids and gradually work up to solid foods.  You may eat anything you prefer, but it is better to start with liquids, then soup and crackers, and gradually work up to solid foods.   7) Please notify your doctor immediately if you have any unusual bleeding, trouble breathing, redness and pain at the surgery site, drainage, fever, or pain not relieved by medication.    8) Additional Instructions:  Please contact your physician with any problems or Same Day Surgery at [phone], Monday through Friday 6 am to 4 pm, or [place] at [facility] number at [phone].

## 2019-03-03 NOTE — OR Nursing (Signed)
Patient desires to have prescription of percocet for discharge, patients states she has taken it in the past with no issues r/t her codeine allergy.  Dr Kenton Kingfisher notified, send in prescription to pharmacy.

## 2019-03-03 NOTE — Anesthesia Postprocedure Evaluation (Signed)
Anesthesia Post Note  Patient: Ashley Moyer  Procedure(s) Performed: LAPAROSCOPIC LEFT OOPHORECTOMY (Left )  Patient location during evaluation: PACU Anesthesia Type: General Level of consciousness: awake and alert Pain management: pain level controlled Vital Signs Assessment: post-procedure vital signs reviewed and stable Respiratory status: spontaneous breathing and respiratory function stable Cardiovascular status: stable Anesthetic complications: no     Last Vitals:  Vitals:   03/03/19 0915 03/03/19 0930  BP: 91/79 (!) 76/66  Pulse: 69 70  Resp: 10 14  Temp:    SpO2: 100% 100%    Last Pain:  Vitals:   03/03/19 0935  TempSrc:   PainSc: (P) 8                  [name] K

## 2019-03-03 NOTE — Interval H&P Note (Signed)
History and Physical Interval Note:  03/03/2019 6:55 AM  Ashley Moyer  has presented today for surgery, with the diagnosis of LLQ pain Dermoid cyst.  The various methods of treatment have been discussed with the patient and family. After consideration of risks, benefits and other options for treatment, the patient has consented to  Procedure(s): LAPAROSCOPIC LEFT OOPHORECTOMY (Left) as a surgical intervention.  The patient's history has been reviewed, patient examined, no change in status, stable for surgery.  I have reviewed the patient's chart and labs.  Questions were answered to the patient's satisfaction.     Hoyt Koch

## 2019-03-03 NOTE — Transfer of Care (Signed)
Immediate Anesthesia Transfer of Care Note  Patient: Ashley Moyer  Procedure(s) Performed: LAPAROSCOPIC LEFT OOPHORECTOMY (Left )  Patient Location: PACU  Anesthesia Type:General  Level of Consciousness: awake  Airway & Oxygen Therapy: Patient connected to face mask oxygen  Post-op Assessment: Post -op Vital signs reviewed and stable  Post vital signs: stable  Last Vitals:  Vitals Value Taken Time  BP 111/39 03/03/19 0854  Temp    Pulse 78 03/03/19 0854  Resp 25 03/03/19 0856  SpO2 99 % 03/03/19 0854  Vitals shown include unvalidated device data.  Last Pain:  Vitals:   03/03/19 0603  TempSrc: Temporal  PainSc: 6          Complications: No apparent anesthesia complications

## 2019-03-03 NOTE — Anesthesia Preprocedure Evaluation (Signed)
Anesthesia Evaluation  Patient identified by MRN, date of birth, ID band Patient awake    Reviewed: Allergy & Precautions, NPO status , Patient's Chart, lab work & pertinent test results  History of Anesthesia Complications Negative for: history of anesthetic complications  Airway Mallampati: II       Dental   Pulmonary asthma , neg sleep apnea, neg COPD, Current Smoker and Patient abstained from smoking.,           Cardiovascular (-) hypertension(-) Past MI and (-) CHF (-) dysrhythmias (-) Valvular Problems/Murmurs     Neuro/Psych neg Seizures Anxiety Depression    GI/Hepatic Neg liver ROS, neg GERD  ,  Endo/Other  neg diabetes  Renal/GU negative Renal ROS     Musculoskeletal   Abdominal   Peds  Hematology   Anesthesia Other Findings   Reproductive/Obstetrics                             Anesthesia Physical Anesthesia Plan  ASA: II  Anesthesia Plan: General   Post-op Pain Management:    Induction:   PONV Risk Score and Plan: 2 and Ondansetron and Dexamethasone  Airway Management Planned: Oral ETT  Additional Equipment:   Intra-op Plan:   Post-operative Plan:   Informed Consent: I have reviewed the patients History and Physical, chart, labs and discussed the procedure including the risks, benefits and alternatives for the proposed anesthesia with the patient or authorized representative who has indicated his/her understanding and acceptance.       Plan Discussed with:   Anesthesia Plan Comments:         Anesthesia Quick Evaluation

## 2019-03-03 NOTE — Anesthesia Procedure Notes (Signed)
Procedure Name: Intubation Date/Time: 03/03/2019 7:35 AM Performed by: Justus Memory, CRNA Pre-anesthesia Checklist: Patient identified, Patient being monitored, Timeout performed, Emergency Drugs available and Suction available Patient Re-evaluated:Patient Re-evaluated prior to induction Oxygen Delivery Method: Circle system utilized Preoxygenation: Pre-oxygenation with 100% oxygen Induction Type: IV induction Ventilation: Mask ventilation without difficulty Laryngoscope Size: 3 and McGraph Grade View: Grade I Tube type: Oral Tube size: 7.0 mm Number of attempts: 1 Airway Equipment and Method: Stylet and Video-laryngoscopy Placement Confirmation: ETT inserted through vocal cords under direct vision,  positive ETCO2 and breath sounds checked- equal and bilateral Secured at: 21 cm Tube secured with: Tape Dental Injury: Teeth and Oropharynx as per pre-operative assessment

## 2019-03-03 NOTE — Op Note (Signed)
Operative Note:  PRE-OP DIAGNOSIS: LLQ pain Dermoid cyst   POST-OP DIAGNOSIS: LLQ pain Dermoid cyst   PROCEDURE: Procedure(s): LAPAROSCOPIC LEFT OOPHORECTOMY  SURGEON: Barnett Applebaum, MD, FACOG  ASSISTANT: Dr Georgianne Fick, No other capable assistant available, in surgery requiring high level assistant.  ANESTHESIA: General endotracheal anesthesia  ESTIMATED BLOOD LOSS: minimal < 10 mL  SPECIMENS: Left Ovary.  COMPLICATIONS: None  DISPOSITION: stable to PACU  FINDINGS: Intraabdominal adhesions were minimally noted. Left Dermoid Cyst.  Normal right ovary.  PROCEDURE IN DETAIL: The patient was taken to the OR where anesthesia was administed. The patient was positioned in the dorsal lithotomy position. Foley placed.  A Hulka Tenaculum was placed on the cervix for manipulation purposes.  Attention was turned to the patient's abdomen where a 5 mm skin incision was made in the umbilical fold, after injection of local anesthesia. The Veress step needle was carefully introduced into the peritoneal cavity with placement confirmed using the hanging drop technique. Pneumoperitoneum was obtained. The 5 mm port was then placed under direct visualization with the operative laparoscope The above noted findings. Trendelenburg.  5 mm and 11 mm trocars were then placed in the LLQ and RLQ respectively lateral to the inferior epigastric blood vessels under direct visualization with the laparoscope. Left ovary is identified and the ovarian blood vessels/infundibulopelvic ligament pedicles are coagulated and ligated using the Harmonic scapel. Dissection is carried out to the uterine-ovarian blood vessels which also are transected for amputation.The ovary is then removed through a specimen bag. No injuries or bleeding was noted.  The right lower quadrant fascia incision is closed with a 0 Vicryl suture using a closure device.  All instruments and ports were then removed from the abdomen after gas was  expelled and patient was leveled. The skin was closed with skin adhesive. The foley catheter was removed. The patient tolerated the procedure well. All counts were correct x 2. The patient was transferred to the recovery room awake, alert and breathing independently.  Barnett Applebaum, MD, [name], [facility] 03/03/2019  8:44 AM

## 2019-03-03 NOTE — OR Nursing (Signed)
Patient desires discharge at this time.  Patient given time that she received pain medication and when sh can have it again.

## 2019-03-04 LAB — SURGICAL PATHOLOGY

## 2019-03-08 ENCOUNTER — Other Ambulatory Visit: Payer: Self-pay | Admitting: Obstetrics & Gynecology

## 2019-03-08 ENCOUNTER — Telehealth: Payer: Self-pay

## 2019-03-08 MED ORDER — OXYCODONE-ACETAMINOPHEN 5-325 MG PO TABS: 1.0000 | ORAL_TABLET | ORAL | 0 refills | Status: DC | PRN

## 2019-03-08 NOTE — Telephone Encounter (Signed)
Pt aware via vm 

## 2019-03-08 NOTE — Telephone Encounter (Signed)
Let her know I have sent in new eRx for Percocet

## 2019-03-08 NOTE — Telephone Encounter (Signed)
Can you call her to let her know?

## 2019-03-08 NOTE — Telephone Encounter (Signed)
Pt called triage line requesting a refill of her percocet because she had dropped off her kids then stopped at the gas station and the bottle fell out of her car and she has no idea where they went. Please advise thank you

## 2019-03-14 ENCOUNTER — Encounter: Payer: Self-pay | Admitting: Obstetrics & Gynecology

## 2019-03-14 ENCOUNTER — Other Ambulatory Visit: Payer: Self-pay

## 2019-03-14 ENCOUNTER — Ambulatory Visit (INDEPENDENT_AMBULATORY_CARE_PROVIDER_SITE_OTHER): Payer: Medicaid Other | Admitting: Obstetrics & Gynecology

## 2019-03-14 VITALS — BP 124/88 | Ht 67.0 in | Wt 272.0 lb

## 2019-03-14 DIAGNOSIS — Z90721 Acquired absence of ovaries, unilateral: Secondary | ICD-10-CM

## 2019-03-14 DIAGNOSIS — Z48816 Encounter for surgical aftercare following surgery on the genitourinary system: Secondary | ICD-10-CM

## 2019-03-14 MED ORDER — OXYCODONE-ACETAMINOPHEN 5-325 MG PO TABS: 1.0000 | ORAL_TABLET | ORAL | 0 refills | Status: DC | PRN

## 2019-03-14 NOTE — Progress Notes (Signed)
  Postoperative Follow-up Patient presents post op from left oophorectomy for pelvic pain and dermoid, 2 weeks ago. Path: DIAGNOSIS:  A. LEFT OVARY, OOPHORECTOMY:  - MATURE TERATOMA / DERMOID CYST.  - NEGATIVE FOR MALIGNANCY.  Subjective: Patient reports some improvement in her preop symptoms. Eating a regular diet without difficulty. Pain is not well controlled.  Medications being used: ibuprofen (OTC).  Activity: normal activities of daily living. Patient reports additional symptom's since surgery of None.  Objective: BP 124/88   Ht 5\' 7"  (1.702 m)   Wt 272 lb (123.4 kg)   LMP 02/16/2019   Breastfeeding No   BMI 42.60 kg/m  Physical Exam Constitutional:      General: She is not in acute distress.    Appearance: She is well-developed.  Cardiovascular:     Rate and Rhythm: Normal rate.  Pulmonary:     Effort: Pulmonary effort is normal.  Abdominal:     General: There is no distension.     Palpations: Abdomen is soft.     Tenderness: There is no abdominal tenderness.     Comments: Incision Healing Well Tender to touch periumb  Musculoskeletal:        General: Normal range of motion.  Neurological:     Mental Status: She is alert and oriented to person, place, and time.     Cranial Nerves: No cranial nerve deficit.  Skin:    General: Skin is warm and dry.     Assessment: s/p :  left oophorectomy and laparoscopy stable  Plan: Patient has done well after surgery with no apparent complications.  I have discussed the post-operative course to date, and the expected progress moving forward.  The patient understands what complications to be concerned about.  I will see the patient in routine follow up, or sooner if needed.    Activity plan: No restriction. Incisional pain and burning, may be neuropathic.  Percocet for now.  Consider Gabapentin.  Pt needs Annual, PAP soon    Defers PAP today, to schedule  Ashley Moyer 03/14/2019, 10:13 AM

## 2019-03-18 ENCOUNTER — Ambulatory Visit: Payer: Medicaid Other | Admitting: Obstetrics & Gynecology

## 2019-03-28 ENCOUNTER — Other Ambulatory Visit: Payer: Self-pay | Admitting: Obstetrics & Gynecology

## 2019-03-28 ENCOUNTER — Telehealth: Payer: Self-pay

## 2019-03-28 MED ORDER — OXYCODONE-ACETAMINOPHEN 5-325 MG PO TABS: 1.0000 | ORAL_TABLET | ORAL | 0 refills | Status: DC | PRN

## 2019-03-28 NOTE — Telephone Encounter (Signed)
Let her know will refill some pain medicine, ans recheck it at her next appointment, unless it gets worse and worse prior to that

## 2019-03-28 NOTE — Telephone Encounter (Signed)
Pt aware.

## 2019-03-28 NOTE — Telephone Encounter (Signed)
Pt calling; was told by [facility] to let him know if she still has pain; still has sharp burning pain right side of stomach; has been out of pain med for a few days trying to tough it out.  Would like a refill of pain med.  [phone]

## 2019-04-08 ENCOUNTER — Other Ambulatory Visit: Payer: Self-pay | Admitting: Obstetrics & Gynecology

## 2019-04-08 MED ORDER — OXYCODONE-ACETAMINOPHEN 5-325 MG PO TABS: 1.0000 | ORAL_TABLET | ORAL | 0 refills | Status: DC | PRN

## 2019-04-08 NOTE — Telephone Encounter (Signed)
Pain medicince as may be needed over weekend.  Will discuss further and alternatives at 04/12/19 appt

## 2019-04-08 NOTE — Telephone Encounter (Signed)
Left message to advise pt of refill.

## 2019-04-08 NOTE — Telephone Encounter (Signed)
Pt called triage asking for a RF on her pain medication, its pretty bad this morning. Please advise.

## 2019-04-12 ENCOUNTER — Ambulatory Visit: Payer: Medicaid Other | Admitting: Obstetrics & Gynecology

## 2019-04-15 ENCOUNTER — Other Ambulatory Visit: Payer: Self-pay

## 2019-04-15 ENCOUNTER — Ambulatory Visit (INDEPENDENT_AMBULATORY_CARE_PROVIDER_SITE_OTHER): Payer: Medicaid Other | Admitting: Obstetrics & Gynecology

## 2019-04-15 ENCOUNTER — Telehealth: Payer: Self-pay

## 2019-04-15 ENCOUNTER — Encounter: Payer: Self-pay | Admitting: Obstetrics & Gynecology

## 2019-04-15 VITALS — BP 100/60 | Ht 65.5 in | Wt 267.0 lb

## 2019-04-15 DIAGNOSIS — M792 Neuralgia and neuritis, unspecified: Secondary | ICD-10-CM

## 2019-04-15 DIAGNOSIS — R1031 Right lower quadrant pain: Secondary | ICD-10-CM | POA: Diagnosis not present

## 2019-04-15 MED ORDER — GABAPENTIN 100 MG PO CAPS: 100.0000 mg | ORAL_CAPSULE | Freq: Three times a day (TID) | ORAL | 4 refills | Status: DC

## 2019-04-15 MED ORDER — OXYCODONE-ACETAMINOPHEN 5-325 MG PO TABS: 1.0000 | ORAL_TABLET | ORAL | 0 refills | Status: DC | PRN

## 2019-04-15 NOTE — Progress Notes (Signed)
Gynecology Pelvic Pain Evaluation   Chief Complaint:  Chief Complaint  Patient presents with  . Post-op Problem    pain right side     History of Present Illness:   Patient is a 29 y.o. G2P1001 who LMP was No LMP recorded., presents today for a problem visit.  She complains of pain.   Her pain is localized to the RLQ and is mostly in the superficial area distal to the RLQ incision from laparoscopy 6 weeks ago (for dermoid excision), described as intermittent, sharp and stabbing, began several weeks ago and its severity is described as severe.  She has had to sit down for an hour, take Percocet to help get relief.  Has interfered w work. The pain radiates to the  Non-radiating. She has these associated symptoms which include none. Patient has these modifiers which include relaxation and pain medication that make it better and unable to associate with any factor that make it worse.  Context includes: spontaneous, or related to Surgery 6 weeks ago.  PMHx: She  has a past medical history of Arthritis, Asthma, Borderline personality disorder ([place]), Depression, Headache(784.0), and PTSD (post-traumatic stress disorder). Also,  has a past surgical history that includes Cholecystectomy; Wisdom tooth extraction; and Laparoscopic ovarian cystectomy (N/A, 08/31/2018)., family history includes Cancer in her maternal aunt, maternal grandmother, and mother.,  reports that she has been smoking cigarettes. She has a 2.25 pack-year smoking history. She has never used smokeless tobacco. She reports previous alcohol use. She reports that she does not use drugs.  She has a current medication list which includes the following prescription(s): albuterol, aspirin-acetaminophen-caffeine, bismuth subsalicylate, gabapentin, and oxycodone-acetaminophen. Also, is allergic to codeine; ibuprofen; and tape.  Review of Systems  Constitutional: Positive for malaise/fatigue. Negative for chills and fever.  HENT: Negative for  congestion, sinus pain and sore throat.   Eyes: Negative for blurred vision and pain.  Respiratory: Negative for cough and wheezing.   Cardiovascular: Negative for chest pain and leg swelling.  Gastrointestinal: Positive for abdominal pain. Negative for constipation, diarrhea, heartburn, nausea and vomiting.  Genitourinary: Negative for dysuria, frequency, hematuria and urgency.  Musculoskeletal: Negative for back pain, joint pain, myalgias and neck pain.  Skin: Negative for itching and rash.  Neurological: Negative for dizziness, tremors and weakness.  Endo/Heme/Allergies: Does not bruise/bleed easily.  Psychiatric/Behavioral: Negative for depression. The patient is nervous/anxious. The patient does not have insomnia.     Objective: BP 100/60   Ht 5' 5.5" (1.664 m)   Wt 267 lb (121.1 kg)   BMI 43.76 kg/m  Physical Exam Constitutional:      General: She is not in acute distress.    Appearance: She is well-developed.  Abdominal:     General: Bowel sounds are normal.     Palpations: Abdomen is soft.     Tenderness: There is abdominal tenderness in the right lower quadrant. There is no guarding or rebound. Negative signs include Murphy's sign and McBurney's sign.     Hernia: No hernia is present.     Comments: Incisions well healed, no mass or even T there  Musculoskeletal:        General: Normal range of motion.  Neurological:     Mental Status: She is alert and oriented to person, place, and time.  Skin:    General: Skin is warm and dry.  Vitals reviewed.    Assessment: 29 y.o. G2P1001 with pain that seems neurogenic in etiology in superficial areas of RLQ, may be  related to laparscopy 6 weeks ago..  1. Neuropathic pain - gabapentin (NEURONTIN) 100 MG capsule; Take 1 capsule (100 mg total) by mouth 3 (three) times daily.  Dispense: 90 capsule; Refill: 4 - Percocet while waiting for Gabapentin to work.  #10. - f/u 2 weeks (tele) - Pain management referral discussed as  next/alternative option  Barnett Applebaum, MD, [name], [facility] 04/15/2019  3:12 PM

## 2019-04-15 NOTE — Telephone Encounter (Signed)
Pt called triage today, she thought her appt was today, but it was earlier this week and she missed it. Pt in a lot of pain, r/s appt to end of the month. But she is requesting to speak with [employer] about the pain and see if he can send in more medication. Please advise

## 2019-04-15 NOTE — Telephone Encounter (Signed)
Patient is schedule for 04/15/19 at 3 pm with [facility]

## 2019-04-15 NOTE — Telephone Encounter (Signed)
Can she be worked in after 2pm today?

## 2019-05-02 ENCOUNTER — Encounter: Payer: Self-pay | Admitting: Obstetrics & Gynecology

## 2019-05-02 ENCOUNTER — Other Ambulatory Visit: Payer: Self-pay

## 2019-05-02 ENCOUNTER — Ambulatory Visit (INDEPENDENT_AMBULATORY_CARE_PROVIDER_SITE_OTHER): Payer: Medicaid Other | Admitting: Obstetrics & Gynecology

## 2019-05-02 VITALS — BP 120/80 | Ht 67.0 in | Wt 270.0 lb

## 2019-05-02 DIAGNOSIS — M792 Neuralgia and neuritis, unspecified: Secondary | ICD-10-CM

## 2019-05-02 NOTE — Progress Notes (Signed)
  History of Present Illness:  Ashley Moyer is a 29 y.o. who was started on  Current Meds  Medication Sig  . gabapentin (NEURONTIN) 100 MG capsule Take 1 capsule (100 mg total) by mouth 3 (three) times daily.   approximately 2 weeks ago. Since that time, she states that her symptoms are improving.  PMHx: She  has a past medical history of Arthritis, Asthma, Borderline personality disorder ([place]), Depression, Headache(784.0), and PTSD (post-traumatic stress disorder). Also,  has a past surgical history that includes Cholecystectomy; Wisdom tooth extraction; and Laparoscopic ovarian cystectomy (N/A, 08/31/2018)., family history includes Cancer in her maternal aunt, maternal grandmother, and mother.,  reports that she has been smoking cigarettes. She has a 2.25 pack-year smoking history. She has never used smokeless tobacco. She reports previous alcohol use. She reports that she does not use drugs. Current Meds  Medication Sig  . gabapentin (NEURONTIN) 100 MG capsule Take 1 capsule (100 mg total) by mouth 3 (three) times daily.  . Also, is allergic to codeine; ibuprofen; and tape..  Review of Systems  All other systems reviewed and are negative.   Physical Exam:  BP 120/80   Ht 5\' 7"  (1.702 m)   Wt 270 lb (122.5 kg)   BMI 42.29 kg/m  Body mass index is 42.29 kg/m. Constitutional: Well nourished, well developed female in no acute distress.  Abdomen: diffusely non tender to palpation, non distended, and no masses, hernias Neuro: Grossly intact Psych:  Normal mood and affect.    Assessment:  Problem List Items Addressed This Visit    Neuropathic pain        Medication treatment is going well for her neuropathic pain in RLQ related to laparscopy.  Plan: She will undergo continuation for 3-4 more weeks in her medical therapy.  Then she will stop.  She is on low dose and does not need taper off.  She was amenable to this plan and we will see her back for annual/PRN.  A total of 20  minutes were spent face-to-face with the patient as well as preparation, review, communication, and documentation during this encounter.   Barnett Applebaum, MD, [name], [facility] 05/02/2019  10:03 AM

## 2019-05-06 ENCOUNTER — Ambulatory Visit: Payer: Medicaid Other | Admitting: Obstetrics & Gynecology

## 2019-07-29 ENCOUNTER — Telehealth: Payer: Self-pay

## 2019-07-29 NOTE — Telephone Encounter (Signed)
So it need to be seen and evaluated urgent care preferable to ER but may need imaging to rule out something like a hernia.  5 months out port site should not be getting infected

## 2019-07-29 NOTE — Telephone Encounter (Signed)
Advise Ashley Moyer what to tell pt. Thanks

## 2019-07-29 NOTE — Telephone Encounter (Signed)
Pt calling; had lap oopherectomy in Jan this year; belly button incision keeps getting infected since surgery; this time it isn't going away; is in a lot of pain, has puss coming out from belly button, area around belly button is red, blotchy and swollen.  Can't get in until Wednesday.  What to do?  Should she go to a walkin or ED?  [phone]

## 2019-07-29 NOTE — Telephone Encounter (Signed)
See AMS message, Please make pt aware

## 2019-07-29 NOTE — Telephone Encounter (Signed)
Left detailed msg as pt's name was on greeting.

## 2019-12-07 ENCOUNTER — Telehealth: Payer: Self-pay | Admitting: Obstetrics & Gynecology

## 2019-12-07 NOTE — Telephone Encounter (Signed)
Patient states she had left Ovary removed last year. She is now having pain on right side with lumps .

## 2019-12-07 NOTE — Telephone Encounter (Signed)
Please schedule appointment.

## 2019-12-09 NOTE — Telephone Encounter (Signed)
Pt is scheduled 11/10

## 2019-12-21 ENCOUNTER — Other Ambulatory Visit: Payer: Self-pay

## 2019-12-21 ENCOUNTER — Ambulatory Visit (INDEPENDENT_AMBULATORY_CARE_PROVIDER_SITE_OTHER): Payer: Medicaid Other | Admitting: Obstetrics & Gynecology

## 2019-12-21 ENCOUNTER — Encounter: Payer: Self-pay | Admitting: Obstetrics & Gynecology

## 2019-12-21 VITALS — BP 130/80 | Ht 67.0 in | Wt 255.0 lb

## 2019-12-21 DIAGNOSIS — D271 Benign neoplasm of left ovary: Secondary | ICD-10-CM | POA: Diagnosis not present

## 2019-12-21 DIAGNOSIS — R1031 Right lower quadrant pain: Secondary | ICD-10-CM

## 2019-12-21 MED ORDER — HYDROCODONE-ACETAMINOPHEN 5-325 MG PO TABS: 1.0000 | ORAL_TABLET | Freq: Four times a day (QID) | ORAL | 0 refills | Status: DC | PRN

## 2019-12-21 NOTE — Progress Notes (Signed)
Gynecology Pelvic Pain Evaluation   Chief Complaint:  Chief Complaint  Patient presents with  . Pelvic Pain    History of Present Illness:   Patient is a 29 y.o. G2P1001 who LMP was Patient's last menstrual period was 12/13/2019., presents today for a problem visit.  She complains of pain.   Her pain is localized to the RLQ area, described as intermittent, sharp and stabbing, began  2 mos ago and its severity is described as moderate. The pain radiates to the  Non-radiating. She has these associated symptoms which include none. Patient has these modifiers which include relaxation that make it better and unable to associate with any factor that make it worse.  Context includes: prioir h/o left dermoid cyst s/p LSO.  Pt is s/p NSVD x2, no desire for future fertility.  PMHx: She  has a past medical history of Arthritis, Asthma, Borderline personality disorder ([place]), Depression, Headache(784.0), and PTSD (post-traumatic stress disorder). Also,  has a past surgical history that includes Cholecystectomy; Wisdom tooth extraction; and Laparoscopic ovarian cystectomy (N/A, 08/31/2018)., family history includes Cancer in her maternal aunt, maternal grandmother, and mother.,  reports that she has been smoking cigarettes. She has a 2.25 pack-year smoking history. She has never used smokeless tobacco. She reports previous alcohol use. She reports that she does not use drugs.  She has a current medication list which includes the following prescription(s): hydrocodone-acetaminophen. Also, is allergic to codeine, ibuprofen, and tape.  Review of Systems  Constitutional: Negative for chills, fever and malaise/fatigue.  HENT: Negative for congestion, sinus pain and sore throat.   Eyes: Negative for blurred vision and pain.  Respiratory: Negative for cough and wheezing.   Cardiovascular: Negative for chest pain and leg swelling.  Gastrointestinal: Positive for abdominal pain. Negative for constipation,  diarrhea, heartburn, nausea and vomiting.  Genitourinary: Negative for dysuria, frequency, hematuria and urgency.  Musculoskeletal: Negative for back pain, joint pain, myalgias and neck pain.  Skin: Negative for itching and rash.  Neurological: Negative for dizziness, tremors and weakness.  Endo/Heme/Allergies: Does not bruise/bleed easily.  Psychiatric/Behavioral: Negative for depression. The patient is not nervous/anxious and does not have insomnia.     Objective: BP 130/80   Ht 5\' 7"  (1.702 m)   Wt 255 lb (115.7 kg)   LMP 12/13/2019   BMI 39.94 kg/m  Physical Exam Constitutional:      General: She is not in acute distress.    Appearance: She is well-developed.  Genitourinary:     Pelvic exam was performed with patient supine.     Vagina and uterus normal.     No vaginal erythema or bleeding.     No cervical motion tenderness, discharge, polyp or nabothian cyst.     Uterus is mobile.     Uterus is not enlarged.     No uterine mass detected.    Uterus is midaxial.     No right or left adnexal mass present.     Right adnexa tender.     Left adnexa not tender.  HENT:     Head: Normocephalic and atraumatic.     Nose: Nose normal.  Abdominal:     General: There is no distension.     Palpations: Abdomen is soft.     Tenderness: There is no abdominal tenderness.  Musculoskeletal:        General: Normal range of motion.  Neurological:     Mental Status: She is alert and oriented to person, place, and time.  Cranial Nerves: No cranial nerve deficit.  Skin:    General: Skin is warm and dry.  Psychiatric:        Attention and Perception: Attention normal.        Mood and Affect: Mood and affect normal.        Speech: Speech normal.        Behavior: Behavior normal.        Thought Content: Thought content normal.        Judgment: Judgment normal.     Female chaperone present for pelvic portion of the physical exam  Assessment: 29 y.o. G2P1001 with new onset right  sided pain.  1. RLQ abdominal pain [place], assess for cyst or mass Norco briefly for pain mgt    (allergic to NSAIDs, codiene) Consider alternative etiology based on sx;s and [place] results  2. 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

## 2019-12-26 ENCOUNTER — Other Ambulatory Visit: Payer: Medicaid Other

## 2019-12-26 ENCOUNTER — Other Ambulatory Visit: Payer: Self-pay | Admitting: Obstetrics & Gynecology

## 2019-12-26 DIAGNOSIS — Z90721 Acquired absence of ovaries, unilateral: Secondary | ICD-10-CM

## 2019-12-26 DIAGNOSIS — D271 Benign neoplasm of left ovary: Secondary | ICD-10-CM

## 2019-12-26 DIAGNOSIS — R1031 Right lower quadrant pain: Secondary | ICD-10-CM

## 2019-12-28 ENCOUNTER — Other Ambulatory Visit: Payer: Self-pay

## 2019-12-28 ENCOUNTER — Ambulatory Visit
Admission: RE | Admit: 2019-12-28 | Discharge: 2019-12-28 | Disposition: A | Discharge status: Home / Self Care | Payer: Medicaid Other | Source: Ambulatory Visit | Attending: Obstetrics & Gynecology | Admitting: Obstetrics & Gynecology

## 2019-12-28 DIAGNOSIS — R1031 Right lower quadrant pain: Secondary | ICD-10-CM | POA: Diagnosis not present

## 2019-12-28 DIAGNOSIS — Z90721 Acquired absence of ovaries, unilateral: Secondary | ICD-10-CM | POA: Insufficient documentation

## 2019-12-28 DIAGNOSIS — D271 Benign neoplasm of left ovary: Secondary | ICD-10-CM | POA: Insufficient documentation

## 2019-12-29 NOTE — Progress Notes (Signed)
Let patient know her ultrasound is normal and not revealing the cause for her pain.

## 2019-12-29 NOTE — Progress Notes (Signed)
Pt aware.

## 2020-02-11 NOTE — L&D Delivery Note (Signed)
Arrived to room where patient was pushing spontaneously. FHTS noted to drop to the 60s with pushing with each contraction. Per Charlsie Merles,  cervix is completely dilated. Patient was pushing well with each contraction with some descent; Dr. Georgianne Fick called to room for possible assist with vacuum or forceps due to repetitive decels. With continuous coaching, Delivery Notevaginally, via  (Presentation:  OA with restitution to LOT    ). Nuchal cord x 1 noted, ad reduced by Dr. Georgianne Fick on the perineum. APGAR: 99, ; weight pending .   Placenta status:  ,intact, delivered Delena Bali  at [phone].  Cord: 3 vessel. with the following complications: none .  Cord pH: NA  Anesthesia:  epidural Episiotomy:  none Lacerations:  none Suture Repair:  NA Est. Blood Loss (mL): 200   Mom to postpartum.  Baby to Couplet care / Skin to Skin.  Imagene Riches 12/20/2020, 8:54 AM

## 2020-04-19 ENCOUNTER — Ambulatory Visit (INDEPENDENT_AMBULATORY_CARE_PROVIDER_SITE_OTHER): Payer: Medicaid Other | Admitting: Obstetrics and Gynecology

## 2020-04-19 ENCOUNTER — Other Ambulatory Visit: Payer: Self-pay

## 2020-04-19 ENCOUNTER — Other Ambulatory Visit (HOSPITAL_COMMUNITY)
Admission: RE | Admit: 2020-04-19 | Discharge: 2020-04-19 | Disposition: A | Discharge status: Home / Self Care | Payer: Medicaid Other | Source: Ambulatory Visit | Attending: Obstetrics and Gynecology | Admitting: Obstetrics and Gynecology

## 2020-04-19 ENCOUNTER — Encounter: Payer: Self-pay | Admitting: Obstetrics and Gynecology

## 2020-04-19 VITALS — BP 120/70 | Wt 261.2 lb

## 2020-04-19 DIAGNOSIS — Z124 Encounter for screening for malignant neoplasm of cervix: Secondary | ICD-10-CM | POA: Diagnosis not present

## 2020-04-19 DIAGNOSIS — N926 Irregular menstruation, unspecified: Secondary | ICD-10-CM

## 2020-04-19 DIAGNOSIS — Z3149 Encounter for other procreative investigation and testing: Secondary | ICD-10-CM

## 2020-04-19 DIAGNOSIS — O219 Vomiting of pregnancy, unspecified: Secondary | ICD-10-CM

## 2020-04-19 DIAGNOSIS — Z3481 Encounter for supervision of other normal pregnancy, first trimester: Secondary | ICD-10-CM

## 2020-04-19 DIAGNOSIS — Z0283 Encounter for blood-alcohol and blood-drug test: Secondary | ICD-10-CM

## 2020-04-19 DIAGNOSIS — O99210 Obesity complicating pregnancy, unspecified trimester: Secondary | ICD-10-CM

## 2020-04-19 DIAGNOSIS — J4520 Mild intermittent asthma, uncomplicated: Secondary | ICD-10-CM

## 2020-04-19 DIAGNOSIS — Z91410 Personal history of adult physical and sexual abuse: Secondary | ICD-10-CM

## 2020-04-19 DIAGNOSIS — O281 Abnormal biochemical finding on antenatal screening of mother: Secondary | ICD-10-CM

## 2020-04-19 DIAGNOSIS — Z7185 Encounter for immunization safety counseling: Secondary | ICD-10-CM

## 2020-04-19 DIAGNOSIS — Z113 Encounter for screening for infections with a predominantly sexual mode of transmission: Secondary | ICD-10-CM

## 2020-04-19 DIAGNOSIS — O0990 Supervision of high risk pregnancy, unspecified, unspecified trimester: Secondary | ICD-10-CM | POA: Insufficient documentation

## 2020-04-19 LAB — OB RESULTS CONSOLE VARICELLA ZOSTER ANTIBODY, IGG: Varicella: IMMUNE

## 2020-04-19 LAB — OB RESULTS CONSOLE GC/CHLAMYDIA: Gonorrhea: NEGATIVE

## 2020-04-19 MED ORDER — DOXYLAMINE-PYRIDOXINE 10-10 MG PO TBEC: 2.0000 | DELAYED_RELEASE_TABLET | Freq: Every day | ORAL | 5 refills | Status: DC
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
## 2020-04-19 NOTE — Patient Instructions (Signed)
For nausea (these may be purchased over-the-counter): -Vitamin B6 (pyridoxine):  25 mg three times each day (may buy 100 mg tablet and take twice per day or try to cut into 4 equal pieces and take 1 piece three times each day).  - doxylamine (found in [employer] and other sleep agents that can be bought in the store): take 12.5 - 25 mg at bedtime.  May take up to 25 mg three time each day.  However, keep in mind that this might make you sleepy.    Obstetrics: Normal and Problem Pregnancies (7th ed., pp. 102-121). [place], [place]: Elsevier."> Textbook of Family Medicine (9th ed., pp. 365-410). [place], [place]: Elsevier [employer].">  First Trimester of Pregnancy  The first trimester of pregnancy starts on the first day of your last menstrual period until the end of week 12. This is months 1 through 3 of pregnancy. A week after a sperm fertilizes an egg, the egg will implant into the wall of the uterus and begin to develop into a baby. By the end of 12 weeks, all the baby's organs will be formed and the baby will be 2-3 inches in size. Body changes during your first trimester Your body goes through many changes during pregnancy. The changes vary and generally return to normal after your baby is born. Physical changes  You may gain or lose weight.  Your breasts may begin to grow larger and become tender. The tissue that surrounds your nipples (areola) may become darker.  Dark spots or blotches (chloasma or mask of pregnancy) may develop on your face.  You may have changes in your hair. These can include thickening or thinning of your hair or changes in texture. Health changes  You may feel nauseous, and you may vomit.  You may have heartburn.  You may develop headaches.  You may develop constipation.  Your gums may bleed and may be sensitive to brushing and flossing. Other changes  You may tire easily.  You may urinate more often.  Your menstrual periods will stop.  You may have a  loss of appetite.  You may develop cravings for certain kinds of food.  You may have changes in your emotions from day to day.  You may have more vivid and strange dreams. Follow these instructions at home: Medicines  Follow your health care provider's instructions regarding medicine use. Specific medicines may be either safe or unsafe to take during pregnancy. Do not take any medicines unless told to by your health care provider.  Take a prenatal vitamin that contains at least 600 micrograms (mcg) of folic acid. Eating and drinking  Eat a healthy diet that includes fresh fruits and vegetables, whole grains, good sources of protein such as meat, eggs, or tofu, and low-fat dairy products.  Avoid raw meat and unpasteurized juice, milk, and cheese. These carry germs that can harm you and your baby.  If you feel nauseous or you vomit: ? Eat 4 or 5 small meals a day instead of 3 large meals. ? Try eating a few soda crackers. ? Drink liquids between meals instead of during meals.  You may need to take these actions to prevent or treat constipation: ? Drink enough fluid to keep your urine pale yellow. ? Eat foods that are high in fiber, such as beans, whole grains, and fresh fruits and vegetables. ? Limit foods that are high in fat and processed sugars, such as fried or sweet foods. Activity  Exercise only as directed by your health   provider. Most people can continue their usual exercise routine during pregnancy. Try to exercise for 30 minutes at least 5 days a week.  Stop exercising if you develop pain or cramping in the lower abdomen or lower back.  Avoid exercising if it is very hot or humid or if you are at high altitude.  Avoid heavy lifting.  If you choose to, you may have sex unless your health care provider tells you not to. Relieving pain and discomfort  Wear a good support bra to relieve breast tenderness.  Rest with your legs elevated if you have leg cramps or low  back pain.  If you develop bulging veins (varicose veins) in your legs: ? Wear support hose as told by your health care provider. ? Elevate your feet for 15 minutes, 3-4 times a day. ? Limit salt in your diet. Safety  Wear your seat belt at all times when driving or riding in a car.  Talk with your health care provider if someone is verbally or physically abusive to you.  Talk with your health care provider if you are feeling sad or have thoughts of hurting yourself. Lifestyle  Do not use hot tubs, steam rooms, or saunas.  Do not douche. Do not use tampons or scented sanitary pads.  Do not use herbal remedies, alcohol, illegal drugs, or medicines that are not approved by your health care provider. Chemicals in these products can harm your baby.  Do not use any products that contain nicotine or tobacco, such as cigarettes, e-cigarettes, and chewing tobacco. If you need help quitting, ask your health care provider.  Avoid cat litter boxes and soil used by cats. These carry germs that can cause birth defects in the baby and possibly loss of the unborn baby (fetus) by miscarriage or stillbirth. General instructions  During routine prenatal visits in the first trimester, your health care provider will do a physical exam, perform necessary tests, and ask you how things are going. Keep all follow-up visits. This is important.  Ask for help if you have counseling or nutritional needs during pregnancy. Your health care provider can offer advice or refer you to specialists for help with various needs.  Schedule a dentist appointment. At home, brush your teeth with a soft toothbrush. Floss gently.  Write down your questions. Take them to your prenatal visits. Where to find more information  American Pregnancy Association: americanpregnancy.[place] and Gynecologists: PoolDevices.com.pt  Office on [employer]:  KeywordPortfolios.com.br Contact a health care provider if you have:  Dizziness.  A fever.  Mild pelvic cramps, pelvic pressure, or nagging pain in the abdominal area.  Nausea, vomiting, or diarrhea that lasts for 24 hours or longer.  A bad-smelling vaginal discharge.  Pain when you urinate.  Known exposure to a contagious illness, such as chickenpox, measles, Zika virus, HIV, or hepatitis. Get help right away if you have:  Spotting or bleeding from your vagina.  Severe abdominal cramping or pain.  Shortness of breath or chest pain.  Any kind of trauma, such as from a fall or a car crash.  New or increased pain, swelling, or redness in an arm or leg. Summary  The first trimester of pregnancy starts on the first day of your last menstrual period until the end of week 12 (months 1 through 3).  Eating 4 or 5 small meals a day rather than 3 large meals may help to relieve nausea and vomiting.  Do not use any products  that contain nicotine or tobacco, such as cigarettes, e-cigarettes, and chewing tobacco. If you need help quitting, ask your health care provider.  Keep all follow-up visits. This is important. This information is not intended to replace advice given to you by your health care provider. Make sure you discuss any questions you have with your health care provider. Document Revised: 07/06/2019 Document Reviewed: 05/12/2019 Elsevier Patient Education  2021 [employer].

## 2020-04-20 ENCOUNTER — Telehealth: Payer: Self-pay

## 2020-04-20 ENCOUNTER — Other Ambulatory Visit: Payer: Self-pay | Admitting: Obstetrics and Gynecology

## 2020-04-20 DIAGNOSIS — Z3481 Encounter for supervision of other normal pregnancy, first trimester: Secondary | ICD-10-CM

## 2020-04-20 LAB — URINE DRUG PANEL 7
Amphetamines, Urine: NEGATIVE ng/mL
Barbiturate Quant, Ur: NEGATIVE ng/mL
Benzodiazepine Quant, Ur: NEGATIVE ng/mL
Cannabinoid Quant, Ur: NEGATIVE ng/mL
Cocaine (Metab.): NEGATIVE ng/mL
Opiate Quant, Ur: NEGATIVE ng/mL
PCP Quant, Ur: NEGATIVE ng/mL

## 2020-04-20 LAB — RPR+RH+ABO+RUB AB+AB SCR+CB...
Antibody Screen: NEGATIVE
HIV Screen 4th Generation wRfx: NONREACTIVE
Hematocrit: 41.9 % (ref 34.0–46.6)
Hemoglobin: 14.1 g/dL (ref 11.1–15.9)
Hepatitis B Surface Ag: NEGATIVE
MCH: 32.2 pg (ref 26.6–33.0)
MCHC: 33.7 g/dL (ref 31.5–35.7)
MCV: 96 fL (ref 79–97)
Platelets: 297 10*3/uL (ref 150–450)
RBC: 4.38 x10E6/uL (ref 3.77–5.28)
RDW: 12.2 % (ref 11.7–15.4)
RPR Ser Ql: NONREACTIVE
Rh Factor: POSITIVE
Rubella Antibodies, IGG: 1.51 index (ref >0.99)
Varicella zoster IgG: 420 index (ref >165)
WBC: 8.2 10*3/uL (ref 3.4–10.8)

## 2020-04-20 MED ORDER — CITRANATAL ASSURE 35-1 & 300 MG PO MISC: 2.0000 | Freq: Every day | ORAL | 3 refills | Status: DC

## 2020-04-20 NOTE — Telephone Encounter (Signed)
Pt calling; her pharm did not receive rx for prenatal vitamins.  [phone]

## 2020-04-20 NOTE — Telephone Encounter (Signed)
Left detailed msg order sent to pharm.

## 2020-04-20 NOTE — Telephone Encounter (Signed)
Would you let her know the order has been placed. Thanks.

## 2020-04-21 LAB — URINE CULTURE

## 2020-04-23 ENCOUNTER — Other Ambulatory Visit: Payer: Self-pay | Admitting: Obstetrics and Gynecology

## 2020-04-23 DIAGNOSIS — O2341 Unspecified infection of urinary tract in pregnancy, first trimester: Secondary | ICD-10-CM

## 2020-04-23 LAB — CYTOLOGY - PAP
Chlamydia: NEGATIVE
Comment: NEGATIVE
Comment: NORMAL
Diagnosis: NEGATIVE
Neisseria Gonorrhea: NEGATIVE

## 2020-04-23 MED ORDER — CEPHALEXIN 500 MG PO CAPS: 500.0000 mg | ORAL_CAPSULE | Freq: Two times a day (BID) | ORAL | 0 refills | Status: AC

## 2020-04-27 ENCOUNTER — Other Ambulatory Visit: Payer: Self-pay

## 2020-04-27 ENCOUNTER — Ambulatory Visit
Admission: RE | Admit: 2020-04-27 | Discharge: 2020-04-27 | Disposition: A | Discharge status: Home / Self Care | Payer: Medicaid Other | Source: Ambulatory Visit | Attending: Obstetrics and Gynecology | Admitting: Obstetrics and Gynecology

## 2020-04-27 DIAGNOSIS — N926 Irregular menstruation, unspecified: Secondary | ICD-10-CM | POA: Insufficient documentation

## 2020-04-30 ENCOUNTER — Encounter: Payer: Self-pay | Admitting: Advanced Practice Midwife

## 2020-04-30 ENCOUNTER — Other Ambulatory Visit: Payer: Medicaid Other

## 2020-04-30 ENCOUNTER — Other Ambulatory Visit: Payer: Self-pay

## 2020-04-30 ENCOUNTER — Ambulatory Visit (INDEPENDENT_AMBULATORY_CARE_PROVIDER_SITE_OTHER): Payer: Medicaid Other | Admitting: Advanced Practice Midwife

## 2020-04-30 VITALS — BP 122/78 | Wt 268.0 lb

## 2020-04-30 DIAGNOSIS — O99211 Obesity complicating pregnancy, first trimester: Secondary | ICD-10-CM

## 2020-04-30 DIAGNOSIS — Z369 Encounter for antenatal screening, unspecified: Secondary | ICD-10-CM

## 2020-04-30 DIAGNOSIS — O0991 Supervision of high risk pregnancy, unspecified, first trimester: Secondary | ICD-10-CM

## 2020-04-30 MED ORDER — PRENATAL + COMPLETE MULTI 0.267 & 373 MG PO THPK: 1.0000 | PACK | Freq: Every day | ORAL | 11 refills | Status: DC

## 2020-04-30 NOTE — Progress Notes (Signed)
Early glucose today. No vb. No lof.
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
## 2020-05-01 LAB — GLUCOSE, 1 HOUR GESTATIONAL: Gestational Diabetes Screen: 57 mg/dL — ABNORMAL LOW (ref 65–139)

## 2020-05-02 LAB — INHERITEST CORE(CF97,SMA,FRAX)

## 2020-05-17 ENCOUNTER — Other Ambulatory Visit: Payer: Self-pay

## 2020-05-17 ENCOUNTER — Ambulatory Visit (INDEPENDENT_AMBULATORY_CARE_PROVIDER_SITE_OTHER): Payer: Medicaid Other

## 2020-05-17 ENCOUNTER — Other Ambulatory Visit: Payer: Self-pay | Admitting: Advanced Practice Midwife

## 2020-05-17 ENCOUNTER — Ambulatory Visit (INDEPENDENT_AMBULATORY_CARE_PROVIDER_SITE_OTHER): Payer: Medicaid Other | Admitting: Advanced Practice Midwife

## 2020-05-17 ENCOUNTER — Encounter: Payer: Self-pay | Admitting: Advanced Practice Midwife

## 2020-05-17 VITALS — BP 120/80 | Ht 67.0 in | Wt 273.0 lb

## 2020-05-17 DIAGNOSIS — Z369 Encounter for antenatal screening, unspecified: Secondary | ICD-10-CM

## 2020-05-17 DIAGNOSIS — Z3A08 8 weeks gestation of pregnancy: Secondary | ICD-10-CM

## 2020-05-17 DIAGNOSIS — O0991 Supervision of high risk pregnancy, unspecified, first trimester: Secondary | ICD-10-CM | POA: Diagnosis not present
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
## 2020-05-31 ENCOUNTER — Other Ambulatory Visit: Payer: Self-pay

## 2020-05-31 ENCOUNTER — Ambulatory Visit (INDEPENDENT_AMBULATORY_CARE_PROVIDER_SITE_OTHER): Payer: Medicaid Other | Admitting: Obstetrics and Gynecology

## 2020-05-31 VITALS — BP 122/80 | Wt 269.0 lb

## 2020-05-31 DIAGNOSIS — B372 Candidiasis of skin and nail: Secondary | ICD-10-CM

## 2020-05-31 DIAGNOSIS — O0990 Supervision of high risk pregnancy, unspecified, unspecified trimester: Secondary | ICD-10-CM

## 2020-05-31 DIAGNOSIS — Z3A10 10 weeks gestation of pregnancy: Secondary | ICD-10-CM

## 2020-05-31 DIAGNOSIS — O219 Vomiting of pregnancy, unspecified: Secondary | ICD-10-CM

## 2020-05-31 DIAGNOSIS — Z3689 Encounter for other specified antenatal screening: Secondary | ICD-10-CM

## 2020-05-31 DIAGNOSIS — Z1379 Encounter for other screening for genetic and chromosomal anomalies: Secondary | ICD-10-CM

## 2020-05-31 LAB — POCT URINALYSIS DIPSTICK OB
Glucose, UA: NEGATIVE
POC,PROTEIN,UA: NEGATIVE

## 2020-05-31 MED ORDER — PROMETHAZINE HCL 25 MG PO TABS: 25.0000 mg | ORAL_TABLET | Freq: Four times a day (QID) | ORAL | 2 refills | Status: DC | PRN

## 2020-05-31 MED ORDER — CLOTRIMAZOLE 1 % EX CREA: TOPICAL_CREAM | Freq: Two times a day (BID) | CUTANEOUS | Status: AC
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
## 2020-06-01 ENCOUNTER — Other Ambulatory Visit: Payer: Self-pay | Admitting: Obstetrics and Gynecology

## 2020-06-01 MED ORDER — NYSTATIN 100000 UNIT/GM EX CREA: 1.0000 "application " | TOPICAL_CREAM | Freq: Two times a day (BID) | CUTANEOUS | 1 refills | Status: DC

## 2020-06-06 LAB — MATERNIT 21 PLUS CORE, BLOOD
Fetal Fraction: 13
Result (T21): NEGATIVE
Trisomy 13 (Patau syndrome): NEGATIVE
Trisomy 18 (Edwards syndrome): NEGATIVE
Trisomy 21 (Down syndrome): NEGATIVE

## 2020-06-28 ENCOUNTER — Ambulatory Visit (INDEPENDENT_AMBULATORY_CARE_PROVIDER_SITE_OTHER): Payer: Medicaid Other | Admitting: Obstetrics & Gynecology

## 2020-06-28 ENCOUNTER — Encounter: Payer: Self-pay | Admitting: Obstetrics & Gynecology

## 2020-06-28 ENCOUNTER — Other Ambulatory Visit: Payer: Self-pay

## 2020-06-28 VITALS — BP 120/80 | Wt 273.0 lb

## 2020-06-28 DIAGNOSIS — Z3A14 14 weeks gestation of pregnancy: Secondary | ICD-10-CM

## 2020-06-28 DIAGNOSIS — O0992 Supervision of high risk pregnancy, unspecified, second trimester: Secondary | ICD-10-CM

## 2020-06-28 DIAGNOSIS — O99212 Obesity complicating pregnancy, second trimester: Secondary | ICD-10-CM

## 2020-06-28 NOTE — Patient Instructions (Signed)
Thank you for choosing [facility]. As part of our ongoing efforts to improve patient experience, we would appreciate your feedback. Please fill out the short survey that you will receive by mail or MyChart. Your opinion is important to us! -Dr Anika Shore  Second Trimester of Pregnancy  The second trimester of pregnancy is from week 13 through week 27. This is months 4 through 6 of pregnancy. The second trimester is often a time when you feel your best. Your body has adjusted to being pregnant, and you begin to feel better physically. During the second trimester:  Morning sickness has lessened or stopped completely.  You may have more energy.  You may have an increase in appetite. The second trimester is also a time when the unborn baby (fetus) is growing rapidly. At the end of the sixth month, the fetus may be up to 12 inches long and weigh about 1 pounds. You will likely begin to feel the baby move (quickening) between 16 and 20 weeks of pregnancy. Body changes during your second trimester Your body continues to go through many changes during your second trimester. The changes vary and generally return to normal after the baby is born. Physical changes  Your weight will continue to increase. You will notice your lower abdomen bulging out.  You may begin to get stretch marks on your hips, abdomen, and breasts.  Your breasts will continue to grow and to become tender.  Dark spots or blotches (chloasma or mask of pregnancy) may develop on your face.  A dark line from your belly button to the pubic area (linea nigra) may appear.  You may have changes in your hair. These can include thickening of your hair, rapid growth, and changes in texture. Some people also have hair loss during or after pregnancy, or hair that feels dry or thin. Health changes  You may develop headaches.  You may have heartburn.  You may develop constipation.  You may develop hemorrhoids or swollen, bulging  veins (varicose veins).  Your gums may bleed and may be sensitive to brushing and flossing.  You may urinate more often because the fetus is pressing on your bladder.  You may have back pain. This is caused by: ? Weight gain. ? Pregnancy hormones that are relaxing the joints in your pelvis. ? A shift in weight and the muscles that support your balance. Follow these instructions at home: Medicines  Follow your health care provider's instructions regarding medicine use. Specific medicines may be either safe or unsafe to take during pregnancy. Do not take any medicines unless approved by your health care provider.  Take a prenatal vitamin that contains at least 600 micrograms (mcg) of folic acid. Eating and drinking  Eat a healthy diet that includes fresh fruits and vegetables, whole grains, good sources of protein such as meat, eggs, or tofu, and low-fat dairy products.  Avoid raw meat and unpasteurized juice, milk, and cheese. These carry germs that can harm you and your baby.  You may need to take these actions to prevent or treat constipation: ? Drink enough fluid to keep your urine pale yellow. ? Eat foods that are high in fiber, such as beans, whole grains, and fresh fruits and vegetables. ? Limit foods that are high in fat and processed sugars, such as fried or sweet foods. Activity  Exercise only as directed by your health care provider. Most people can continue their usual exercise routine during pregnancy. Try to exercise for 30 minutes at least   5 days a week. Stop exercising if you develop contractions in your uterus.  Stop exercising if you develop pain or cramping in the lower abdomen or lower back.  Avoid exercising if it is very hot or humid or if you are at a high altitude.  Avoid heavy lifting.  If you choose to, you may have sex unless your health care provider tells you not to. Relieving pain and discomfort  Wear a supportive bra to prevent discomfort from  breast tenderness.  Take warm sitz baths to soothe any pain or discomfort caused by hemorrhoids. Use hemorrhoid cream if your health care provider approves.  Rest with your legs raised (elevated) if you have leg cramps or low back pain.  If you develop varicose veins: ? Wear support hose as told by your health care provider. ? Elevate your feet for 15 minutes, 3-4 times a day. ? Limit salt in your diet. Safety  Wear your seat belt at all times when driving or riding in a car.  Talk with your health care provider if someone is verbally or physically abusive to you. Lifestyle  Do not use hot tubs, steam rooms, or saunas.  Do not douche. Do not use tampons or scented sanitary pads.  Avoid cat litter boxes and soil used by cats. These carry germs that can cause birth defects in the baby and possibly loss of the fetus by miscarriage or stillbirth.  Do not use herbal remedies, alcohol, illegal drugs, or medicines that are not approved by your health care provider. Chemicals in these products can harm your baby.  Do not use any products that contain nicotine or tobacco, such as cigarettes, e-cigarettes, and chewing tobacco. If you need help quitting, ask your health care provider. General instructions  During a routine prenatal visit, your health care provider will do a physical exam and other tests. He or she will also discuss your overall health. Keep all follow-up visits. This is important.  Ask your health care provider for a referral to a local prenatal education class.  Ask for help if you have counseling or nutritional needs during pregnancy. Your health care provider can offer advice or refer you to specialists for help with various needs. Where to find more information  American Pregnancy Association: americanpregnancy.org  American College of Obstetricians and Gynecologists: acog.org/en/Womens%20Health/Pregnancy  Office on Women's Health: womenshealth.gov/pregnancy Contact  a health care provider if you have:  A headache that does not go away when you take medicine.  Vision changes or you see spots in front of your eyes.  Mild pelvic cramps, pelvic pressure, or nagging pain in the abdominal area.  Persistent nausea, vomiting, or diarrhea.  A bad-smelling vaginal discharge or foul-smelling urine.  Pain when you urinate.  Sudden or extreme swelling of your face, hands, ankles, feet, or legs.  A fever. Get help right away if you:  Have fluid leaking from your vagina.  Have spotting or bleeding from your vagina.  Have severe abdominal cramping or pain.  Have difficulty breathing.  Have chest pain.  Have fainting spells.  Have not felt your baby move for the time period told by your health care provider.  Have new or increased pain, swelling, or redness in an arm or leg. Summary  The second trimester of pregnancy is from week 13 through week 27 (months 4 through 6).  Do not use herbal remedies, alcohol, illegal drugs, or medicines that are not approved by your health care provider. Chemicals in these products can   harm your baby.  Exercise only as directed by your health care provider. Most people can continue their usual exercise routine during pregnancy.  Keep all follow-up visits. This is important. This information is not intended to replace advice given to you by your health care provider. Make sure you discuss any questions you have with your health care provider. Document Revised: 07/06/2019 Document Reviewed: 05/12/2019 Elsevier Patient Education  2021 Elsevier Inc.  

## 2020-06-28 NOTE — Progress Notes (Signed)
  Subjective  Mild nausea, no pain  Objective  BP 120/80   Wt 273 lb (123.8 kg)   LMP  (LMP Unknown)   BMI 42.76 kg/m  General: NAD Pumonary: no increased work of breathing Abdomen: gravid, non-tender Extremities: no edema Psychiatric: mood appropriate, affect full FHT by [place] today Assessment  30 y.o. [id number] at [redacted]w[redacted]d by  12/25/2020, by Ultrasound presenting for routine prenatal visit  Plan   Problem List Items Addressed This Visit      Other   Supervision of high risk pregnancy, antepartum   Obesity affecting pregnancy    Other Visit Diagnoses    [redacted] weeks gestation of pregnancy    -  Primary      pregnancy 3 Problems (from 04/19/20 to present)    Problem Noted Resolved   Supervision of high risk pregnancy, antepartum 04/19/2020 by Orlie Pollen, CNM No   Overview Addendum 06/28/2020  9:49 AM by Gae Dry, MD     Nursing Staff Provider  Office Location  [facility] Dating  EDD by 8w u/s  Language  English Anatomy US   ordered at 10 weeks  Flu Vaccine   declined Genetic Screen  NIPS: nml XX  TDaP vaccine    Hgb A1C or  GTT Early : 57 Third trimester :   Rhogam    n/a   LAB RESULTS   Feeding Plan  breast Blood Type   A+  Contraception  Antibody   negative  Circumcision  Rubella   immune  Pediatrician   RPR   non-reactive  Support Person  Water engineer, boyfriend HBsAg   negative  Prenatal Classes  HIV   non-reactive    Varicella   immune   BTL Consent  GBS  (For PCN allergy, check sensitivities)        VBAC Consent  n/a Pap  04/19/20 - NILM    Hgb Electro   n/a    CF  negative     SMA  negative              Previous Version   Obesity affecting pregnancy 04/19/2020 by Orlie Pollen, CNM No   Overview Signed 06/28/2020  9:48 AM by Gae Dry, MD    BMI >=40 [x ] early 1h gtt - 57 [ ]  screen sleep apnea [x ] anesthesia consult (3rd trimester) [x ] u/s for dating [x ]  [x ] nutritional goals [x ] folic acid 1mg  [x ] bASA (>12 weeks) [ ]  consider  nutrition consult [ ]  consider maternal EKG 1st trimester [ ]  Growth u/s 28 [ ] , 32 [ ] , 36 weeks [ ]  [ ]  NST/AFI weekly 34+ weeks (34[] ,35[] ,36[] , 37[] , 38[] , 39[] , 40[] ) [ ]  IOL by 41 weeks (scheduled, prn [] )           Barnett Applebaum, MD, [name], [facility] 06/28/2020  10:05 AM

## 2020-07-26 ENCOUNTER — Ambulatory Visit
Admission: RE | Admit: 2020-07-26 | Discharge: 2020-07-26 | Disposition: A | Discharge status: Home / Self Care | Payer: Medicaid Other | Source: Ambulatory Visit | Attending: Obstetrics and Gynecology | Admitting: Obstetrics and Gynecology

## 2020-07-26 ENCOUNTER — Other Ambulatory Visit: Payer: Self-pay

## 2020-07-26 DIAGNOSIS — Z3689 Encounter for other specified antenatal screening: Secondary | ICD-10-CM | POA: Diagnosis not present

## 2020-07-30 ENCOUNTER — Encounter: Payer: Medicaid Other | Admitting: Obstetrics and Gynecology

## 2020-08-01 ENCOUNTER — Other Ambulatory Visit: Payer: Self-pay

## 2020-08-01 ENCOUNTER — Ambulatory Visit (INDEPENDENT_AMBULATORY_CARE_PROVIDER_SITE_OTHER): Payer: Medicaid Other | Admitting: Obstetrics and Gynecology

## 2020-08-01 VITALS — BP 101/63 | Wt 275.0 lb

## 2020-08-01 DIAGNOSIS — Z3A19 19 weeks gestation of pregnancy: Secondary | ICD-10-CM

## 2020-08-01 DIAGNOSIS — J4520 Mild intermittent asthma, uncomplicated: Secondary | ICD-10-CM

## 2020-08-01 DIAGNOSIS — O0992 Supervision of high risk pregnancy, unspecified, second trimester: Secondary | ICD-10-CM

## 2020-08-01 DIAGNOSIS — O99212 Obesity complicating pregnancy, second trimester: Secondary | ICD-10-CM

## 2020-08-01 DIAGNOSIS — O0990 Supervision of high risk pregnancy, unspecified, unspecified trimester: Secondary | ICD-10-CM

## 2020-08-01 DIAGNOSIS — Z362 Encounter for other antenatal screening follow-up: Secondary | ICD-10-CM

## 2020-08-01 LAB — POCT URINALYSIS DIPSTICK OB
Glucose, UA: NEGATIVE
POC,PROTEIN,UA: NEGATIVE

## 2020-08-01 NOTE — Progress Notes (Signed)
ROB - F/U anatomy, syncopal episodes with last pregnancy concerned same with this pregnancy. Procedure RM
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
## 2020-08-28 ENCOUNTER — Other Ambulatory Visit: Payer: Self-pay

## 2020-08-28 ENCOUNTER — Ambulatory Visit (INDEPENDENT_AMBULATORY_CARE_PROVIDER_SITE_OTHER): Payer: Medicaid Other | Admitting: Obstetrics and Gynecology

## 2020-08-28 ENCOUNTER — Encounter: Payer: Self-pay | Admitting: Obstetrics and Gynecology

## 2020-08-28 VITALS — BP 120/80 | Wt 278.0 lb

## 2020-08-28 DIAGNOSIS — Z113 Encounter for screening for infections with a predominantly sexual mode of transmission: Secondary | ICD-10-CM

## 2020-08-28 DIAGNOSIS — O0992 Supervision of high risk pregnancy, unspecified, second trimester: Secondary | ICD-10-CM

## 2020-08-28 DIAGNOSIS — Z131 Encounter for screening for diabetes mellitus: Secondary | ICD-10-CM

## 2020-08-28 DIAGNOSIS — O0990 Supervision of high risk pregnancy, unspecified, unspecified trimester: Secondary | ICD-10-CM

## 2020-08-28 DIAGNOSIS — Z3A23 23 weeks gestation of pregnancy: Secondary | ICD-10-CM

## 2020-08-28 DIAGNOSIS — O99212 Obesity complicating pregnancy, second trimester: Secondary | ICD-10-CM

## 2020-08-28 LAB — POCT URINALYSIS DIPSTICK OB
Glucose, UA: NEGATIVE
POC,PROTEIN,UA: NEGATIVE
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
## 2020-09-07 ENCOUNTER — Encounter: Payer: Medicaid Other | Admitting: Obstetrics and Gynecology

## 2020-09-18 ENCOUNTER — Other Ambulatory Visit: Payer: Self-pay

## 2020-09-18 ENCOUNTER — Ambulatory Visit: Payer: Medicaid Other | Attending: Obstetrics and Gynecology

## 2020-09-18 ENCOUNTER — Other Ambulatory Visit: Payer: Self-pay | Admitting: *Deleted

## 2020-09-18 ENCOUNTER — Ambulatory Visit: Payer: Medicaid Other | Admitting: *Deleted

## 2020-09-18 ENCOUNTER — Encounter: Payer: Self-pay | Admitting: *Deleted

## 2020-09-18 VITALS — BP 113/59 | HR 88

## 2020-09-18 DIAGNOSIS — O0990 Supervision of high risk pregnancy, unspecified, unspecified trimester: Secondary | ICD-10-CM | POA: Insufficient documentation

## 2020-09-18 DIAGNOSIS — Z362 Encounter for other antenatal screening follow-up: Secondary | ICD-10-CM | POA: Insufficient documentation

## 2020-09-18 DIAGNOSIS — O99212 Obesity complicating pregnancy, second trimester: Secondary | ICD-10-CM | POA: Insufficient documentation

## 2020-09-18 DIAGNOSIS — J4520 Mild intermittent asthma, uncomplicated: Secondary | ICD-10-CM | POA: Diagnosis present

## 2020-09-20 ENCOUNTER — Ambulatory Visit (INDEPENDENT_AMBULATORY_CARE_PROVIDER_SITE_OTHER): Payer: Medicaid Other | Admitting: Obstetrics and Gynecology

## 2020-09-20 ENCOUNTER — Other Ambulatory Visit: Payer: Self-pay

## 2020-09-20 VITALS — BP 110/66 | Wt 284.0 lb

## 2020-09-20 DIAGNOSIS — O99212 Obesity complicating pregnancy, second trimester: Secondary | ICD-10-CM

## 2020-09-20 DIAGNOSIS — Z3A26 26 weeks gestation of pregnancy: Secondary | ICD-10-CM

## 2020-09-20 DIAGNOSIS — O0990 Supervision of high risk pregnancy, unspecified, unspecified trimester: Secondary | ICD-10-CM

## 2020-09-20 LAB — POCT URINALYSIS DIPSTICK OB
Glucose, UA: NEGATIVE
POC,PROTEIN,UA: NEGATIVE

## 2020-09-20 NOTE — Progress Notes (Signed)
ROB - Frannie 09/18/20. RM 4
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
## 2020-10-05 ENCOUNTER — Other Ambulatory Visit: Payer: Medicaid Other

## 2020-10-05 ENCOUNTER — Encounter: Payer: Self-pay | Admitting: Obstetrics and Gynecology

## 2020-10-05 ENCOUNTER — Ambulatory Visit (INDEPENDENT_AMBULATORY_CARE_PROVIDER_SITE_OTHER): Payer: Medicaid Other | Admitting: Obstetrics

## 2020-10-05 ENCOUNTER — Other Ambulatory Visit: Payer: Self-pay

## 2020-10-05 ENCOUNTER — Observation Stay
Admission: EM | Admit: 2020-10-05 | Discharge: 2020-10-05 | Disposition: A | Discharge status: Home / Self Care | Payer: Medicaid Other | Attending: Obstetrics and Gynecology | Admitting: Obstetrics and Gynecology

## 2020-10-05 VITALS — BP 118/68 | Wt 284.0 lb

## 2020-10-05 DIAGNOSIS — Z3A00 Weeks of gestation of pregnancy not specified: Secondary | ICD-10-CM | POA: Diagnosis not present

## 2020-10-05 DIAGNOSIS — O0992 Supervision of high risk pregnancy, unspecified, second trimester: Secondary | ICD-10-CM

## 2020-10-05 DIAGNOSIS — O26893 Other specified pregnancy related conditions, third trimester: Secondary | ICD-10-CM | POA: Diagnosis present

## 2020-10-05 DIAGNOSIS — O0990 Supervision of high risk pregnancy, unspecified, unspecified trimester: Secondary | ICD-10-CM

## 2020-10-05 DIAGNOSIS — O99891 Other specified diseases and conditions complicating pregnancy: Secondary | ICD-10-CM | POA: Diagnosis not present

## 2020-10-05 DIAGNOSIS — Z3A28 28 weeks gestation of pregnancy: Secondary | ICD-10-CM

## 2020-10-05 DIAGNOSIS — Z131 Encounter for screening for diabetes mellitus: Secondary | ICD-10-CM

## 2020-10-05 DIAGNOSIS — R109 Unspecified abdominal pain: Secondary | ICD-10-CM | POA: Diagnosis present

## 2020-10-05 DIAGNOSIS — O479 False labor, unspecified: Principal | ICD-10-CM | POA: Insufficient documentation

## 2020-10-05 DIAGNOSIS — O99212 Obesity complicating pregnancy, second trimester: Secondary | ICD-10-CM

## 2020-10-05 DIAGNOSIS — Z113 Encounter for screening for infections with a predominantly sexual mode of transmission: Secondary | ICD-10-CM

## 2020-10-05 LAB — URINALYSIS, ROUTINE W REFLEX MICROSCOPIC
Bilirubin Urine: NEGATIVE
Glucose, UA: NEGATIVE mg/dL
Hgb urine dipstick: NEGATIVE
Ketones, ur: 80 mg/dL — AB
Nitrite: NEGATIVE
Protein, ur: NEGATIVE mg/dL
Specific Gravity, Urine: 1.02 (ref 1.005–1.030)
pH: 7 (ref 5.0–8.0)

## 2020-10-05 LAB — POCT URINALYSIS DIPSTICK OB
Glucose, UA: NEGATIVE
POC,PROTEIN,UA: NEGATIVE

## 2020-10-05 MED ORDER — NITROFURANTOIN MONOHYD MACRO 100 MG PO CAPS: 100.0000 mg | ORAL_CAPSULE | Freq: Two times a day (BID) | ORAL | 0 refills | Status: AC

## 2020-10-05 MED ORDER — NITROFURANTOIN MONOHYD MACRO 100 MG PO CAPS
100.0000 mg | ORAL_CAPSULE | Freq: Two times a day (BID) | ORAL | Status: DC
  Administered 2020-10-05: 100 mg via ORAL
  Filled 2020-10-05: qty 1
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
## 2020-10-05 NOTE — OB Triage Note (Signed)
Pt is a GP2 started having contraction around 8:00p, in her lower abdomen. Pt reports no LOF, and no bleeding. Pt report positive fetal movement.

## 2020-10-05 NOTE — Discharge Summary (Signed)
Physician Discharge Summary  Patient ID: ADONICA SOUTHWORTH MRN: [id number] DOB/AGE: 29-08-1990 29 y.o.  Admit date: 10/05/2020 Discharge date: 10/05/2020  Admission Diagnoses:   Abdominal pain in pregnancy, third trimester  Discharge Diagnoses:  Active Problems:   Abdominal pain in pregnancy, third trimester   Discharged Condition: good  Hospital Course: Patient presented from home today with complaints of contractions. She  reports that prior to arrival she had contractions every 10 minute for 45 minutes. The contractions were  severe and felt like labor. Since being here she has not had contractions on the monitor. Her infant has been reactive with a reassuring tracing. UA was suggestive of a UTI. Will treat with macrobid. First dose given here.  She had a mucus like discharge a few days ago, but no discharge since. Denies leakage of fluid. Denies vaginal bleeding.   Collected FFN but not sent since cervical exam was closed. Watery discahrge with collection of  FFN.   Consults: None  Significant Diagnostic Studies: UA, see epic  Treatments: antibiotics: macrobid  Discharge Exam: Blood pressure (!) 112/54, pulse (!) 105, temperature 98.9 F (37.2 C), temperature source Oral, resp. rate 16, height 5' 7.5" (1.715 m), weight 128.8 kg. General appearance: alert and cooperative Neurologic: Alert and oriented  PELVIC: SVE: closed, long, high  Disposition: Discharge disposition: 01-Home or Self Care        Allergies as of 10/05/2020       Reactions   Codeine Other (See Comments)   headache   Ibuprofen Nausea And Vomiting   Tape Other (See Comments)   SURGICAL-RASH  Paper tape OK        Medication List     TAKE these medications    Doxylamine-Pyridoxine 10-10 MG Tbec Commonly known as: Diclegis Take 2 tablets by mouth at bedtime. If symptoms persist, add one tablet in the morning and one in the afternoon   nitrofurantoin (macrocrystal-monohydrate) 100 MG  capsule Commonly known as: MACROBID Take 1 capsule (100 mg total) by mouth 2 (two) times daily for 5 days.   nystatin cream Commonly known as: MYCOSTATIN Apply 1 application topically 2 (two) times daily. Apply to umbilicus.   PRENATAL GUMMIES PO Take by mouth.   promethazine 25 MG tablet Commonly known as: PHENERGAN Take 1 tablet (25 mg total) by mouth every 6 (six) hours as needed for nausea or vomiting.         Signed: Homero Fellers 10/05/2020, 10:48 PM

## 2020-10-05 NOTE — Progress Notes (Signed)
ROB - 1 hr gtt, no concerns, RM 4

## 2020-10-06 LAB — 28 WEEK RH+PANEL
Basophils Absolute: 0 10*3/uL (ref 0.0–0.2)
Basos: 0 %
EOS (ABSOLUTE): 0.2 10*3/uL (ref 0.0–0.4)
Eos: 2 %
Gestational Diabetes Screen: 124 mg/dL (ref 65–139)
HIV Screen 4th Generation wRfx: NONREACTIVE
Hematocrit: 34.4 % (ref 34.0–46.6)
Hemoglobin: 11.7 g/dL (ref 11.1–15.9)
Immature Grans (Abs): 0 10*3/uL (ref 0.0–0.1)
Immature Granulocytes: 1 %
Lymphocytes Absolute: 1.2 10*3/uL (ref 0.7–3.1)
Lymphs: 14 %
MCH: 32.3 pg (ref 26.6–33.0)
MCHC: 34 g/dL (ref 31.5–35.7)
MCV: 95 fL (ref 79–97)
Monocytes Absolute: 0.4 10*3/uL (ref 0.1–0.9)
Monocytes: 4 %
Neutrophils Absolute: 6.8 10*3/uL (ref 1.4–7.0)
Neutrophils: 79 %
Platelets: 263 10*3/uL (ref 150–450)
RBC: 3.62 x10E6/uL — ABNORMAL LOW (ref 3.77–5.28)
RDW: 11.8 % (ref 11.7–15.4)
RPR Ser Ql: NONREACTIVE
WBC: 8.6 10*3/uL (ref 3.4–10.8)

## 2020-10-07 ENCOUNTER — Other Ambulatory Visit: Payer: Self-pay

## 2020-10-07 ENCOUNTER — Observation Stay
Admission: EM | Admit: 2020-10-07 | Discharge: 2020-10-07 | Disposition: A | Discharge status: Home / Self Care | Payer: Medicaid Other | Attending: Obstetrics and Gynecology | Admitting: Obstetrics and Gynecology

## 2020-10-07 ENCOUNTER — Encounter: Payer: Self-pay | Admitting: Obstetrics and Gynecology

## 2020-10-07 DIAGNOSIS — Z3A28 28 weeks gestation of pregnancy: Secondary | ICD-10-CM | POA: Insufficient documentation

## 2020-10-07 DIAGNOSIS — O4692 Antepartum hemorrhage, unspecified, second trimester: Secondary | ICD-10-CM | POA: Diagnosis not present

## 2020-10-07 DIAGNOSIS — O26852 Spotting complicating pregnancy, second trimester: Secondary | ICD-10-CM | POA: Diagnosis not present

## 2020-10-07 DIAGNOSIS — O99512 Diseases of the respiratory system complicating pregnancy, second trimester: Secondary | ICD-10-CM | POA: Diagnosis not present

## 2020-10-07 DIAGNOSIS — J45909 Unspecified asthma, uncomplicated: Secondary | ICD-10-CM | POA: Insufficient documentation

## 2020-10-07 DIAGNOSIS — O368320 Maternal care for abnormalities of the fetal heart rate or rhythm, second trimester, not applicable or unspecified: Secondary | ICD-10-CM | POA: Diagnosis not present

## 2020-10-07 DIAGNOSIS — O99332 Smoking (tobacco) complicating pregnancy, second trimester: Secondary | ICD-10-CM | POA: Diagnosis not present

## 2020-10-07 DIAGNOSIS — F17210 Nicotine dependence, cigarettes, uncomplicated: Secondary | ICD-10-CM | POA: Insufficient documentation

## 2020-10-07 DIAGNOSIS — O99212 Obesity complicating pregnancy, second trimester: Secondary | ICD-10-CM

## 2020-10-07 DIAGNOSIS — O0990 Supervision of high risk pregnancy, unspecified, unspecified trimester: Secondary | ICD-10-CM

## 2020-10-07 NOTE — Final Progress Note (Signed)
Final Progress Note  Patient ID: Ashley Moyer MRN: [id number] DOB/AGE: Oct 16, 1990 30 y.o.  Admit date: 10/07/2020 Admitting provider: Imagene Riches, CNM Discharge date: 10/07/2020   Admission Diagnoses: vaginal bleeding in pregnancy  Discharge Diagnoses: postcoital spotting in pregnancy  Reactive fetal heart tones  History of Present Illness: The patient is a 30 y.o. female G3P2002 at 38w5dwho presents for evaluation of some minimal spotting that occurred this afternoon while she was shopping. She noticed a quarter sized spot of blood after using the bathroom.. her husband was very concerned about this and insisted she present to the hospital for evaluation. She reports ample fetal movement today, and denies any leaking of fluid, or contractions or other episode of vaginal bleeding. She was just seen in the office several days abor, and also made another visit to the hsoptial two days ago for contractions, when it was determined she was not in labor or contracting.  Past Medical History:  Diagnosis Date   Arthritis    KNEE   Asthma    WELL CONTROLLED   Borderline personality disorder ([place]    Depression    Headache(784.0)    PTSD (post-traumatic stress disorder)     Past Surgical History:  Procedure Laterality Date   CHOLECYSTECTOMY     LAPAROSCOPIC OVARIAN CYSTECTOMY N/A 08/31/2018   Procedure: LAPAROSCOPIC OVARIAN CYSTECTOMY;  Surgeon: HGae Dry MD;  Location: [facility] ORS;  Service: Gynecology;  Laterality: N/A;   WISDOM TOOTH EXTRACTION      No current facility-administered medications on file prior to encounter.   Current Outpatient Medications on File Prior to Encounter  Medication Sig Dispense Refill   nitrofurantoin, macrocrystal-monohydrate, (MACROBID) 100 MG capsule Take 1 capsule (100 mg total) by mouth 2 (two) times daily for 5 days. 10 capsule 0   nystatin cream (MYCOSTATIN) Apply 1 application topically 2 (two) times daily. Apply to umbilicus. 30 g 1    omeprazole (PRILOSEC) 10 MG capsule Take 10 mg by mouth daily.     Prenatal MV & Min w/FA-DHA (PRENATAL GUMMIES PO) Take by mouth.     promethazine (PHENERGAN) 25 MG tablet Take 1 tablet (25 mg total) by mouth every 6 (six) hours as needed for nausea or vomiting. 30 tablet 2   Doxylamine-Pyridoxine (DICLEGIS) 10-10 MG TBEC Take 2 tablets by mouth at bedtime. If symptoms persist, add one tablet in the morning and one in the afternoon (Patient not taking: No sig reported) 100 tablet 5    Allergies  Allergen Reactions   Codeine Other (See Comments)    headache   Ibuprofen Nausea And Vomiting   Tape Other (See Comments)    SURGICAL-RASH  Paper tape OK    Social History   Socioeconomic History   Marital status: Single    Spouse name: Not on file   Number of children: Not on file   Years of education: Not on file   Highest education level: Not on file  Occupational History   Not on file  Tobacco Use   Smoking status: Every Day    Packs/day: 0.25    Years: 9.00    Pack years: 2.25    Types: Cigarettes   Smokeless tobacco: Never  Vaping Use   Vaping Use: Never used  Substance and Sexual Activity   Alcohol use: Not Currently    Comment: rarely   Drug use: No   Sexual activity: Not Currently  Other Topics Concern   Not on file  Social History Narrative  Not on file   Social Determinants of Health   Financial Resource Strain: Not on file  Food Insecurity: Not on file  Transportation Needs: Not on file  Physical Activity: Not on file  Stress: Not on file  Social Connections: Not on file  Intimate Partner Violence: Not on file    Family History  Problem Relation Age of Onset   Cancer Mother    Cancer Maternal Aunt    Cancer Maternal Grandmother      ROS   Physical Exam: BP 115/60 (BP Location: Left Arm)   Pulse 89   Temp 98.4 F (36.9 C) (Oral)   Resp 18   Ht 5' 7.5" (1.715 m)   Wt 128.8 kg   LMP  (LMP Unknown)   BMI 43.82 kg/m   OBGyn  Exam  Consults: None  Significant Findings/ Diagnostic Studies: See EFM  Procedures: EFM NST Baseline FHR: 120-125  beats/min Variability: moderate Accelerations: present Decelerations: absent Tocometry: no contraction seen per toco or palpated  Interpretation:  INDICATIONS: patient reassurance RESULTS:  A NST procedure was performed with FHR monitoring and a normal baseline established, appropriate time of 20-40 minutes of evaluation, and accels >2 seen w 15x15 characteristics.  Results show a REACTIVE NST.    Hospital Course: The patient was admitted to Labor and Delivery Triage for observation. She was interviewed and placed on the fetal monitor. The fetal heart tones were reactive and reassuring, her vital signs normal, and she denies any other vaginal spotting.  With a reactive NST and reassurance, she is discharged home with plans for follow [facility] at [facility] .  Discharge Condition: good  Disposition: Discharge disposition: 01-Home or Self Care      Diet: Regular diet  Discharge Activity: Activity as tolerated  Discharge Instructions     Notify physician for a general feeling that "something is not right"   Complete by: As directed    Notify physician for increase or change in vaginal discharge   Complete by: As directed    Notify physician for intestinal cramps, with or without diarrhea, sometimes described as "gas pain"   Complete by: As directed    Notify physician for leaking of fluid   Complete by: As directed    Notify physician for low, dull backache, unrelieved by heat or Tylenol   Complete by: As directed    Notify physician for menstrual like cramps   Complete by: As directed    Notify physician for pelvic pressure   Complete by: As directed    Notify physician for uterine contractions.  These may be painless and feel like the uterus is tightening or the baby is  "balling up"   Complete by: As directed    Notify physician for vaginal bleeding    Complete by: As directed    PRETERM LABOR:  Includes any of the follwing symptoms that occur between 20 - [redacted] weeks gestation.  If these symptoms are not stopped, preterm labor can result in preterm delivery, placing your baby at risk   Complete by: As directed       Allergies as of 10/07/2020       Reactions   Codeine Other (See Comments)   headache   Ibuprofen Nausea And Vomiting   Tape Other (See Comments)   SURGICAL-RASH  Paper tape OK        Medication List     TAKE these medications    Doxylamine-Pyridoxine 10-10 MG Tbec Commonly known as: Diclegis Take 2  tablets by mouth at bedtime. If symptoms persist, add one tablet in the morning and one in the afternoon   nitrofurantoin (macrocrystal-monohydrate) 100 MG capsule Commonly known as: MACROBID Take 1 capsule (100 mg total) by mouth 2 (two) times daily for 5 days.   nystatin cream Commonly known as: MYCOSTATIN Apply 1 application topically 2 (two) times daily. Apply to umbilicus.   omeprazole 10 MG capsule Commonly known as: PRILOSEC Take 10 mg by mouth daily.   PRENATAL GUMMIES PO Take by mouth.   promethazine 25 MG tablet Commonly known as: PHENERGAN Take 1 tablet (25 mg total) by mouth every 6 (six) hours as needed for nausea or vomiting.         Total time spent taking care of this patient: 25 minutes  Signed: Imagene Riches, CNM  10/07/2020, 6:53 PM

## 2020-10-07 NOTE — Discharge Summary (Signed)
  Please see Final Progress Note  Imagene Riches, [facility]  10/07/2020 6:44 PM

## 2020-10-07 NOTE — Progress Notes (Signed)
Pt discharged home per M.Fryer,CNM order.  Pt stable and ambulatory and an After Visit Summary was printed and given to the patient. Discharge education completed with patient/family including follow up instructions, appointments, and medication list. No further bleeding in L&D triage. NST reactive and appropriate for gestational age. Pt received labor and bleeding precautions. Patient able to verbalize understanding, all questions fully answered upon discharge. Patient instructed to return to ED, call 911, or call MD for any changes in condition. Pt discharged home via personal vehicle with support person.

## 2020-10-07 NOTE — OB Triage Note (Signed)
Pt Ashley Moyer 30 y.o. presents to labor and delivery triage reporting vaginal bleeding. Pt is a G3P2002 at [redacted]w[redacted]d. Pt denies signs and symptons consistent with rupture and contractions and states positive fetal movement. Pt reports occasional braxton hicks and was seen in L&D triage for contractions on Friday 10/05/2020. Pt was started on macrobid and is taking her antibiotic course for UTI. Pt used the bathroom at about 1730 at the grocery store and reports quarter size clot in the toilet and smudges of bright red bleeding on toilet paper. Bleeding stopped after three wipes with toilet paper. Pt's significant other called the nurse line and pt was advised to come to L&D. Pt does not report any new pain associated with bleeding. Pt states no further bleeding after the initial encounter. No bleeding on assessment, new small plad placed under pt. External FM and TOCO applied to non-tender abdomen and assessing. Initial FHR 140 . Vital signs obtained and within normal limits. Provider notified of pt.

## 2020-10-08 LAB — URINE CULTURE: Culture: 70000 — AB

## 2020-10-17 ENCOUNTER — Other Ambulatory Visit: Payer: Self-pay

## 2020-10-17 ENCOUNTER — Ambulatory Visit: Payer: Medicaid Other | Attending: Obstetrics

## 2020-10-17 ENCOUNTER — Ambulatory Visit: Payer: Medicaid Other | Admitting: *Deleted

## 2020-10-17 ENCOUNTER — Encounter: Payer: Self-pay | Admitting: *Deleted

## 2020-10-17 VITALS — BP 122/55 | HR 70

## 2020-10-17 DIAGNOSIS — O99213 Obesity complicating pregnancy, third trimester: Secondary | ICD-10-CM

## 2020-10-17 DIAGNOSIS — O0990 Supervision of high risk pregnancy, unspecified, unspecified trimester: Secondary | ICD-10-CM | POA: Insufficient documentation

## 2020-10-17 DIAGNOSIS — E669 Obesity, unspecified: Secondary | ICD-10-CM

## 2020-10-17 DIAGNOSIS — Z362 Encounter for other antenatal screening follow-up: Secondary | ICD-10-CM | POA: Diagnosis present

## 2020-10-17 DIAGNOSIS — Z3A30 30 weeks gestation of pregnancy: Secondary | ICD-10-CM

## 2020-10-18 ENCOUNTER — Other Ambulatory Visit: Payer: Self-pay | Admitting: *Deleted

## 2020-10-18 DIAGNOSIS — Z6841 Body Mass Index (BMI) 40.0 and over, adult: Secondary | ICD-10-CM

## 2020-10-19 ENCOUNTER — Other Ambulatory Visit: Payer: Self-pay

## 2020-10-19 ENCOUNTER — Ambulatory Visit (INDEPENDENT_AMBULATORY_CARE_PROVIDER_SITE_OTHER): Payer: Medicaid Other | Admitting: Advanced Practice Midwife

## 2020-10-19 ENCOUNTER — Encounter: Payer: Self-pay | Admitting: Advanced Practice Midwife

## 2020-10-19 VITALS — BP 120/80 | Wt 283.0 lb

## 2020-10-19 DIAGNOSIS — Z23 Encounter for immunization: Secondary | ICD-10-CM | POA: Diagnosis not present

## 2020-10-19 DIAGNOSIS — O0993 Supervision of high risk pregnancy, unspecified, third trimester: Secondary | ICD-10-CM

## 2020-10-19 DIAGNOSIS — O0990 Supervision of high risk pregnancy, unspecified, unspecified trimester: Secondary | ICD-10-CM

## 2020-10-19 DIAGNOSIS — Z3A30 30 weeks gestation of pregnancy: Secondary | ICD-10-CM

## 2020-10-19 DIAGNOSIS — O99213 Obesity complicating pregnancy, third trimester: Secondary | ICD-10-CM
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
## 2020-11-08 ENCOUNTER — Other Ambulatory Visit: Payer: Self-pay

## 2020-11-08 ENCOUNTER — Encounter: Payer: Self-pay | Admitting: Advanced Practice Midwife

## 2020-11-08 ENCOUNTER — Ambulatory Visit (INDEPENDENT_AMBULATORY_CARE_PROVIDER_SITE_OTHER): Payer: Medicaid Other | Admitting: Advanced Practice Midwife

## 2020-11-08 VITALS — BP 118/76 | Wt 288.0 lb

## 2020-11-08 DIAGNOSIS — Z3A33 33 weeks gestation of pregnancy: Secondary | ICD-10-CM

## 2020-11-08 DIAGNOSIS — O99213 Obesity complicating pregnancy, third trimester: Secondary | ICD-10-CM

## 2020-11-08 DIAGNOSIS — O0993 Supervision of high risk pregnancy, unspecified, third trimester: Secondary | ICD-10-CM

## 2020-11-08 LAB — POCT URINALYSIS DIPSTICK OB
Glucose, UA: NEGATIVE
POC,PROTEIN,UA: NEGATIVE
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
## 2020-11-08 NOTE — Progress Notes (Signed)
ROB- discuss induction date, no concerns

## 2020-11-14 ENCOUNTER — Encounter: Payer: Self-pay | Admitting: Advanced Practice Midwife

## 2020-11-14 ENCOUNTER — Other Ambulatory Visit: Payer: Self-pay

## 2020-11-14 ENCOUNTER — Ambulatory Visit (INDEPENDENT_AMBULATORY_CARE_PROVIDER_SITE_OTHER): Payer: Medicaid Other | Admitting: Advanced Practice Midwife

## 2020-11-14 VITALS — BP 104/67 | Wt 292.0 lb

## 2020-11-14 DIAGNOSIS — O99213 Obesity complicating pregnancy, third trimester: Secondary | ICD-10-CM | POA: Diagnosis not present

## 2020-11-14 DIAGNOSIS — Z3A34 34 weeks gestation of pregnancy: Secondary | ICD-10-CM

## 2020-11-14 DIAGNOSIS — O0990 Supervision of high risk pregnancy, unspecified, unspecified trimester: Secondary | ICD-10-CM

## 2020-11-14 LAB — POCT URINALYSIS DIPSTICK OB
Glucose, UA: NEGATIVE
POC,PROTEIN,UA: NEGATIVE

## 2020-11-14 NOTE — Addendum Note (Signed)
Addended by: Tamala Julian R on: 11/14/2020 11:11 AM   Modules accepted: Orders

## 2020-11-14 NOTE — Progress Notes (Signed)
ROB - NST, no concerns. RM 2
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
## 2020-11-20 ENCOUNTER — Other Ambulatory Visit: Payer: Self-pay

## 2020-11-20 ENCOUNTER — Observation Stay
Admission: EM | Admit: 2020-11-20 | Discharge: 2020-11-21 | Disposition: A | Discharge status: Home / Self Care | Payer: Medicaid Other | Attending: Obstetrics & Gynecology | Admitting: Obstetrics & Gynecology

## 2020-11-20 DIAGNOSIS — O99330 Smoking (tobacco) complicating pregnancy, unspecified trimester: Secondary | ICD-10-CM | POA: Insufficient documentation

## 2020-11-20 DIAGNOSIS — O4703 False labor before 37 completed weeks of gestation, third trimester: Secondary | ICD-10-CM | POA: Diagnosis present

## 2020-11-20 DIAGNOSIS — F17210 Nicotine dependence, cigarettes, uncomplicated: Secondary | ICD-10-CM | POA: Diagnosis not present

## 2020-11-20 DIAGNOSIS — O99513 Diseases of the respiratory system complicating pregnancy, third trimester: Secondary | ICD-10-CM | POA: Diagnosis not present

## 2020-11-20 DIAGNOSIS — J45909 Unspecified asthma, uncomplicated: Secondary | ICD-10-CM | POA: Diagnosis not present

## 2020-11-20 DIAGNOSIS — Z7982 Long term (current) use of aspirin: Secondary | ICD-10-CM | POA: Diagnosis not present

## 2020-11-20 DIAGNOSIS — Z3A35 35 weeks gestation of pregnancy: Secondary | ICD-10-CM | POA: Diagnosis not present

## 2020-11-20 DIAGNOSIS — Z3490 Encounter for supervision of normal pregnancy, unspecified, unspecified trimester: Secondary | ICD-10-CM

## 2020-11-20 DIAGNOSIS — Z79899 Other long term (current) drug therapy: Secondary | ICD-10-CM | POA: Diagnosis not present

## 2020-11-20 MED ORDER — MORPHINE SULFATE (PF) 10 MG/ML IV SOLN
10.0000 mg | Freq: Once | INTRAVENOUS | Status: AC
  Administered 2020-11-20: 10 mg via INTRAMUSCULAR
  Filled 2020-11-20: qty 1

## 2020-11-20 NOTE — OB Triage Note (Signed)
No hx of PTL/PTD Denies any BP or Blood sugar probles with this pregnancy. States other pregnancies uncomplicated

## 2020-11-20 NOTE — OB Triage Note (Signed)
Pt is G3P2 at 35 weeks presents with painful UCs and pressure every 15 min starting at approx 1830. +FM, no LOF or discharge. States no sexual intercourse. Adequate fluid intake today.

## 2020-11-20 NOTE — Progress Notes (Signed)
Morphine given and explained therapeutic rest to pt. She understands and will call for any changes. Pt and sign other given pillows and blankets and comfort items.

## 2020-11-21 ENCOUNTER — Encounter: Payer: Medicaid Other | Admitting: Obstetrics and Gynecology

## 2020-11-21 DIAGNOSIS — R1084 Generalized abdominal pain: Secondary | ICD-10-CM | POA: Diagnosis not present

## 2020-11-21 DIAGNOSIS — Z3A35 35 weeks gestation of pregnancy: Secondary | ICD-10-CM | POA: Diagnosis not present

## 2020-11-21 DIAGNOSIS — O26893 Other specified pregnancy related conditions, third trimester: Secondary | ICD-10-CM | POA: Diagnosis not present

## 2020-11-21 DIAGNOSIS — O4703 False labor before 37 completed weeks of gestation, third trimester: Secondary | ICD-10-CM | POA: Diagnosis present

## 2020-11-21 NOTE — Discharge Summary (Signed)
   Please see Final Progress Note. Imagene Riches, CNM  11/21/2020 8:55 AM

## 2020-11-21 NOTE — Progress Notes (Signed)
Pt c/o pain to shoulder and chest. Pt very anxious. Reassurance given. Cool cloth to head. Offered chair to sit up and help pt find more comfortable position. Pt dclines. Support person bed moved closer to pt for her comfort. Asked pt if she has ever taken any medication for anxiety and she denies.

## 2020-11-21 NOTE — Progress Notes (Signed)
Pt given discharge paperwork. Pt scheduled appt for next week. Pt discharged with FOB.

## 2020-11-21 NOTE — Final Progress Note (Signed)
Final Progress Note  Patient ID: Ashley Moyer MRN: [id number] DOB/AGE: November 23, 1990 30 y.o.  Admit date: 11/20/2020 Admitting provider: Gae Dry, MD Discharge date: 11/21/2020   Admission Diagnoses: preterm contractions  Discharge Diagnoses:  Active Problems:   Pregnant  Reactive fetal heart tones No preterm labor  History of Present Illness: The patient is a 30 y.o. female G3P2002 at [redacted]w[redacted]d who presents for evaluation of contractions that started during the day on 11/20/2020 while she was at a football game. She shares that she was addressing a "meltdown" by her autistic sono at the game, and began feeling her abdomen tighten. . Initially , upon her arrival to the unit, she complained of Ucs every 3-10 minutes.  She was monitored, provided oral hydration with water, and rested therapeutically with IM Morphine. She denied LOF or vaginal bleeding, and also denied any recent intercourse. Per nursing report, the pateint was demonstrating anxiety later last evening, but the morning of 11/21/2020, she is calm, answering questions appropriately.  Past Medical History:  Diagnosis Date   Arthritis    KNEE   Asthma    WELL CONTROLLED   Borderline personality disorder ([place])    Depression    Headache(784.0)    PTSD (post-traumatic stress disorder)     Past Surgical History:  Procedure Laterality Date   CHOLECYSTECTOMY     LAPAROSCOPIC OVARIAN CYSTECTOMY N/A 08/31/2018   Procedure: LAPAROSCOPIC OVARIAN CYSTECTOMY;  Surgeon: Gae Dry, MD;  Location: [facility] ORS;  Service: Gynecology;  Laterality: N/A;   WISDOM TOOTH EXTRACTION      No current facility-administered medications on file prior to encounter.   Current Outpatient Medications on File Prior to Encounter  Medication Sig Dispense Refill   aspirin EC 81 MG tablet Take 81 mg by mouth daily. Swallow whole.     nystatin cream (MYCOSTATIN) Apply 1 application topically 2 (two) times daily. Apply to umbilicus. 30 g 1    omeprazole (PRILOSEC) 10 MG capsule Take 10 mg by mouth daily.     Prenatal MV & Min w/FA-DHA (PRENATAL GUMMIES PO) Take by mouth.     PROAIR HFA 108 (90 Base) MCG/ACT inhaler Inhale into the lungs. (Patient not taking: Reported on 11/08/2020)     promethazine (PHENERGAN) 25 MG tablet Take 1 tablet (25 mg total) by mouth every 6 (six) hours as needed for nausea or vomiting. 30 tablet 2    Allergies  Allergen Reactions   Ibuprofen Nausea And Vomiting   Tape Other (See Comments)    SURGICAL-RASH  Paper tape OK    Social History   Socioeconomic History   Marital status: Single    Spouse name: Not on file   Number of children: Not on file   Years of education: Not on file   Highest education level: Not on file  Occupational History   Not on file  Tobacco Use   Smoking status: Every Day    Packs/day: 0.25    Years: 9.00    Pack years: 2.25    Types: Cigarettes   Smokeless tobacco: Never  Vaping Use   Vaping Use: Never used  Substance and Sexual Activity   Alcohol use: Not Currently    Comment: rarely   Drug use: No   Sexual activity: Not Currently  Other Topics Concern   Not on file  Social History Narrative   Not on file   Social Determinants of Health   Financial Resource Strain: Not on file  Food Insecurity: Not on file  Transportation Needs: Not on file  Physical Activity: Not on file  Stress: Not on file  Social Connections: Not on file  Intimate Partner Violence: Not on file    Family History  Problem Relation Age of Onset   Cancer Mother    Cancer Maternal Aunt    Cancer Maternal Grandmother      Review of Systems  Constitutional: Negative.   HENT: Negative.    Eyes: Negative.   Respiratory: Negative.    Cardiovascular: Negative.   Gastrointestinal: Negative.   Genitourinary: Negative.   Musculoskeletal: Negative.   Skin: Negative.   Neurological: Negative.   Endo/Heme/Allergies: Negative.   Psychiatric/Behavioral: Negative.      Physical  Exam: BP 108/61 (BP Location: Right Arm)   Pulse 88   Temp 98.4 F (36.9 C) (Oral)   Resp 17   Ht 5\' 7"  (1.702 m)   Wt 132.5 kg   LMP  (LMP Unknown)   SpO2 100%   BMI 45.75 kg/m   Physical Exam Constitutional:      Appearance: Normal appearance. She is obese.  Genitourinary:     Vulva and rectum normal.     Genitourinary Comments: SVE: closed/long/ballotable  HENT:     Head: Normocephalic and atraumatic.  Cardiovascular:     Rate and Rhythm: Normal rate and regular rhythm.  Pulmonary:     Effort: Pulmonary effort is normal.     Breath sounds: Normal breath sounds.  Abdominal:     General: Bowel sounds are normal.     Palpations: Abdomen is soft.     Comments: Gravid uterus  Musculoskeletal:        General: Normal range of motion.     Cervical back: Normal range of motion and neck supple.  Neurological:     General: No focal deficit present.     Mental Status: She is alert and oriented to person, place, and time.  Skin:    General: Skin is warm and dry.  Psychiatric:        Mood and Affect: Mood normal.        Behavior: Behavior normal.    Consults: None  Significant Findings/ Diagnostic Studies: none  Procedures: EFM NST Baseline FHR: 125 baseline beats/min Variability: moderate Accelerations: present Decelerations: absent Tocometry: rare, brief contractions visible. Abdomen palpates soft  Interpretation:  INDICATIONS: rule out uterine contractions RESULTS:  A NST procedure was performed with FHR monitoring and a normal baseline established, appropriate time of 20-40 minutes of evaluation, and accels >2 seen w 15x15 characteristics.  Results show a REACTIVE NST.    Hospital Course: The patient was admitted to Labor and Delivery Triage for observation. She was monitored for contractions, and her cervix checked by an Therapist, sports.  With po fluid hydration, therapueutic rest with IM Morphine she was able to sleep during the night. Her cervix was rechecked by the CNM and  found to be closed on 12/01/2020. With reassuring  and reactive FHTS, and  a closed cervix, she is discharged home. She will call the [facility] office and reschedule her next prenatal appointment for a week form today with Dr. Glennon Mac, which includes a ROB and NST.  Discharge Condition: good  Disposition: Discharge disposition: 01-Home or Self Care      Diet: Regular diet  Discharge Activity: Activity as tolerated  Discharge Instructions     Fetal Kick Count:  Lie on our left side for one hour after a meal, and count the number of times your baby kicks.  If it is less than 5 times, get up, move around and drink some juice.  Repeat the test 30 minutes later.  If it is still less than 5 kicks in an hour, notify your doctor.   Complete by: As directed    LABOR:  When conractions begin, you should start to time them from the beginning of one contraction to the beginning  of the next.  When contractions are 5 - 10 minutes apart or less and have been regular for at least an hour, you should call your health care provider.   Complete by: As directed    Notify physician for bleeding from the vagina   Complete by: As directed    Notify physician for blurring of vision or spots before the eyes   Complete by: As directed    Notify physician for chills or fever   Complete by: As directed    Notify physician for fainting spells, "black outs" or loss of consciousness   Complete by: As directed    Notify physician for increase in vaginal discharge   Complete by: As directed    Notify physician for leaking of fluid   Complete by: As directed    Notify physician for pain or burning when urinating   Complete by: As directed    Notify physician for pelvic pressure (sudden increase)   Complete by: As directed    Notify physician for severe or continued nausea or vomiting   Complete by: As directed    Notify physician for sudden gushing of fluid from the vagina (with or without continued leaking)    Complete by: As directed    Notify physician for sudden, constant, or occasional abdominal pain   Complete by: As directed    Notify physician if baby moving less than usual   Complete by: As directed       Allergies as of 11/21/2020       Reactions   Ibuprofen Nausea And Vomiting   Tape Other (See Comments)   SURGICAL-RASH  Paper tape OK        Medication List     TAKE these medications    aspirin EC 81 MG tablet Take 81 mg by mouth daily. Swallow whole.   nystatin cream Commonly known as: MYCOSTATIN Apply 1 application topically 2 (two) times daily. Apply to umbilicus.   omeprazole 10 MG capsule Commonly known as: PRILOSEC Take 10 mg by mouth daily.   PRENATAL GUMMIES PO Take by mouth.   ProAir HFA 108 (90 Base) MCG/ACT inhaler Generic drug: albuterol Inhale into the lungs.   promethazine 25 MG tablet Commonly known as: PHENERGAN Take 1 tablet (25 mg total) by mouth every 6 (six) hours as needed for nausea or vomiting.        Follow-up Information     Will Bonnet, MD. Schedule an appointment as soon as possible for a visit in 1 week(s).   Specialty: Obstetrics and Gynecology Why: Call the [facility] office and make our next appointment in one week with Dr. Glennon Mac for a ROB and NST. Contact information: [address] [place] [address] [phone]                 Total time spent taking care of this patient: 30 minutes  Signed: Imagene Riches, CNM  11/21/2020, 8:56 AM

## 2020-11-28 ENCOUNTER — Ambulatory Visit (INDEPENDENT_AMBULATORY_CARE_PROVIDER_SITE_OTHER): Payer: Medicaid Other | Admitting: Advanced Practice Midwife

## 2020-11-28 ENCOUNTER — Other Ambulatory Visit (HOSPITAL_COMMUNITY)
Admission: RE | Admit: 2020-11-28 | Discharge: 2020-11-28 | Disposition: A | Discharge status: Home / Self Care | Payer: Medicaid Other | Source: Ambulatory Visit | Attending: Advanced Practice Midwife | Admitting: Advanced Practice Midwife

## 2020-11-28 ENCOUNTER — Encounter: Payer: Self-pay | Admitting: Advanced Practice Midwife

## 2020-11-28 ENCOUNTER — Ambulatory Visit: Payer: Medicaid Other | Attending: Obstetrics and Gynecology

## 2020-11-28 ENCOUNTER — Encounter: Payer: Self-pay | Admitting: *Deleted

## 2020-11-28 ENCOUNTER — Ambulatory Visit: Payer: Medicaid Other | Admitting: *Deleted

## 2020-11-28 ENCOUNTER — Other Ambulatory Visit: Payer: Self-pay

## 2020-11-28 VITALS — BP 119/78 | Wt 287.0 lb

## 2020-11-28 VITALS — BP 113/69 | HR 107

## 2020-11-28 DIAGNOSIS — Z6841 Body Mass Index (BMI) 40.0 and over, adult: Secondary | ICD-10-CM

## 2020-11-28 DIAGNOSIS — O99213 Obesity complicating pregnancy, third trimester: Secondary | ICD-10-CM | POA: Insufficient documentation

## 2020-11-28 DIAGNOSIS — Z3A36 36 weeks gestation of pregnancy: Secondary | ICD-10-CM | POA: Insufficient documentation

## 2020-11-28 DIAGNOSIS — B009 Herpesviral infection, unspecified: Secondary | ICD-10-CM | POA: Insufficient documentation

## 2020-11-28 DIAGNOSIS — E669 Obesity, unspecified: Secondary | ICD-10-CM

## 2020-11-28 DIAGNOSIS — Z369 Encounter for antenatal screening, unspecified: Secondary | ICD-10-CM

## 2020-11-28 DIAGNOSIS — O0990 Supervision of high risk pregnancy, unspecified, unspecified trimester: Secondary | ICD-10-CM | POA: Diagnosis present

## 2020-11-28 DIAGNOSIS — O0993 Supervision of high risk pregnancy, unspecified, third trimester: Secondary | ICD-10-CM | POA: Diagnosis present

## 2020-11-28 DIAGNOSIS — Z3685 Encounter for antenatal screening for Streptococcus B: Secondary | ICD-10-CM

## 2020-11-28 DIAGNOSIS — Z113 Encounter for screening for infections with a predominantly sexual mode of transmission: Secondary | ICD-10-CM | POA: Diagnosis present

## 2020-11-28 LAB — POCT URINALYSIS DIPSTICK OB
Glucose, UA: NEGATIVE
POC,PROTEIN,UA: NEGATIVE

## 2020-11-28 LAB — OB RESULTS CONSOLE GC/CHLAMYDIA: Gonorrhea: NEGATIVE

## 2020-11-28 MED ORDER — VALACYCLOVIR HCL 500 MG PO TABS: 500.0000 mg | ORAL_TABLET | Freq: Two times a day (BID) | ORAL | 6 refills | Status: DC

## 2020-11-28 NOTE — Progress Notes (Signed)
ROB - NST, GBS,  no concerns. Procedure RM
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
## 2020-11-29 LAB — CERVICOVAGINAL ANCILLARY ONLY
Chlamydia: POSITIVE — AB
Comment: NEGATIVE
Comment: NEGATIVE
Comment: NORMAL
Neisseria Gonorrhea: NEGATIVE
Trichomonas: NEGATIVE

## 2020-11-30 ENCOUNTER — Other Ambulatory Visit: Payer: Self-pay | Admitting: Advanced Practice Midwife

## 2020-11-30 DIAGNOSIS — O98813 Other maternal infectious and parasitic diseases complicating pregnancy, third trimester: Secondary | ICD-10-CM

## 2020-11-30 LAB — STREP GP B NAA: Strep Gp B NAA: NEGATIVE

## 2020-11-30 MED ORDER — AZITHROMYCIN 500 MG PO TABS: 1000.0000 mg | ORAL_TABLET | Freq: Once | ORAL | 1 refills | Status: AC

## 2020-11-30 NOTE — Progress Notes (Signed)
Rx azithromycin sent to patient pharmacy. Message sent to patient. Communicable disease reporting done.

## 2020-12-05 ENCOUNTER — Other Ambulatory Visit: Payer: Self-pay | Admitting: *Deleted

## 2020-12-05 ENCOUNTER — Ambulatory Visit: Payer: Medicaid Other | Admitting: *Deleted

## 2020-12-05 ENCOUNTER — Encounter: Payer: Self-pay | Admitting: *Deleted

## 2020-12-05 ENCOUNTER — Other Ambulatory Visit: Payer: Self-pay

## 2020-12-05 ENCOUNTER — Ambulatory Visit: Payer: Medicaid Other | Attending: Obstetrics and Gynecology

## 2020-12-05 VITALS — BP 106/52 | HR 77

## 2020-12-05 DIAGNOSIS — O99213 Obesity complicating pregnancy, third trimester: Secondary | ICD-10-CM | POA: Diagnosis present

## 2020-12-05 DIAGNOSIS — O0990 Supervision of high risk pregnancy, unspecified, unspecified trimester: Secondary | ICD-10-CM | POA: Diagnosis present

## 2020-12-05 DIAGNOSIS — B009 Herpesviral infection, unspecified: Secondary | ICD-10-CM

## 2020-12-05 DIAGNOSIS — E669 Obesity, unspecified: Secondary | ICD-10-CM | POA: Diagnosis not present

## 2020-12-05 DIAGNOSIS — Z3A37 37 weeks gestation of pregnancy: Secondary | ICD-10-CM | POA: Diagnosis not present

## 2020-12-05 DIAGNOSIS — O0993 Supervision of high risk pregnancy, unspecified, third trimester: Secondary | ICD-10-CM | POA: Diagnosis not present

## 2020-12-05 DIAGNOSIS — O283 Abnormal ultrasonic finding on antenatal screening of mother: Secondary | ICD-10-CM

## 2020-12-05 DIAGNOSIS — Z6841 Body Mass Index (BMI) 40.0 and over, adult: Secondary | ICD-10-CM

## 2020-12-06 ENCOUNTER — Ambulatory Visit (INDEPENDENT_AMBULATORY_CARE_PROVIDER_SITE_OTHER): Payer: Medicaid Other | Admitting: Advanced Practice Midwife

## 2020-12-06 VITALS — BP 111/83 | Wt 290.0 lb

## 2020-12-06 DIAGNOSIS — A749 Chlamydial infection, unspecified: Secondary | ICD-10-CM

## 2020-12-06 DIAGNOSIS — Z3A37 37 weeks gestation of pregnancy: Secondary | ICD-10-CM

## 2020-12-06 DIAGNOSIS — O98813 Other maternal infectious and parasitic diseases complicating pregnancy, third trimester: Secondary | ICD-10-CM

## 2020-12-06 DIAGNOSIS — O99213 Obesity complicating pregnancy, third trimester: Secondary | ICD-10-CM | POA: Diagnosis not present

## 2020-12-06 DIAGNOSIS — O0990 Supervision of high risk pregnancy, unspecified, unspecified trimester: Secondary | ICD-10-CM | POA: Diagnosis not present

## 2020-12-06 MED ORDER — AZITHROMYCIN 500 MG PO TABS: 1000.0000 mg | ORAL_TABLET | Freq: Once | ORAL | 0 refills | Status: AC

## 2020-12-06 NOTE — Progress Notes (Signed)
No vb. No lof. NST today. Has questions about u/s
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
## 2020-12-07 ENCOUNTER — Encounter: Payer: Medicaid Other | Admitting: Advanced Practice Midwife

## 2020-12-12 ENCOUNTER — Encounter: Payer: Self-pay | Admitting: *Deleted

## 2020-12-12 ENCOUNTER — Ambulatory Visit: Payer: Medicaid Other

## 2020-12-12 ENCOUNTER — Other Ambulatory Visit: Payer: Self-pay

## 2020-12-12 ENCOUNTER — Other Ambulatory Visit: Payer: Self-pay | Admitting: Obstetrics and Gynecology

## 2020-12-12 ENCOUNTER — Ambulatory Visit: Payer: Medicaid Other | Admitting: *Deleted

## 2020-12-12 ENCOUNTER — Ambulatory Visit: Payer: Medicaid Other | Attending: Obstetrics and Gynecology

## 2020-12-12 VITALS — BP 112/60 | HR 97

## 2020-12-12 DIAGNOSIS — Z6841 Body Mass Index (BMI) 40.0 and over, adult: Secondary | ICD-10-CM

## 2020-12-12 DIAGNOSIS — E669 Obesity, unspecified: Secondary | ICD-10-CM | POA: Diagnosis not present

## 2020-12-12 DIAGNOSIS — O99213 Obesity complicating pregnancy, third trimester: Secondary | ICD-10-CM

## 2020-12-12 DIAGNOSIS — O283 Abnormal ultrasonic finding on antenatal screening of mother: Secondary | ICD-10-CM

## 2020-12-12 DIAGNOSIS — Z3A38 38 weeks gestation of pregnancy: Secondary | ICD-10-CM | POA: Diagnosis not present

## 2020-12-12 DIAGNOSIS — O0990 Supervision of high risk pregnancy, unspecified, unspecified trimester: Secondary | ICD-10-CM

## 2020-12-12 DIAGNOSIS — B009 Herpesviral infection, unspecified: Secondary | ICD-10-CM | POA: Diagnosis present

## 2020-12-12 DIAGNOSIS — O0993 Supervision of high risk pregnancy, unspecified, third trimester: Secondary | ICD-10-CM | POA: Diagnosis not present

## 2020-12-14 ENCOUNTER — Other Ambulatory Visit: Payer: Self-pay

## 2020-12-14 ENCOUNTER — Ambulatory Visit (INDEPENDENT_AMBULATORY_CARE_PROVIDER_SITE_OTHER): Payer: Medicaid Other | Admitting: Advanced Practice Midwife

## 2020-12-14 VITALS — BP 120/80 | Wt 289.0 lb

## 2020-12-14 DIAGNOSIS — O99213 Obesity complicating pregnancy, third trimester: Secondary | ICD-10-CM

## 2020-12-14 DIAGNOSIS — Z3A38 38 weeks gestation of pregnancy: Secondary | ICD-10-CM

## 2020-12-14 DIAGNOSIS — O0990 Supervision of high risk pregnancy, unspecified, unspecified trimester: Secondary | ICD-10-CM

## 2020-12-14 LAB — POCT URINALYSIS DIPSTICK OB
Glucose, UA: NEGATIVE
POC,PROTEIN,UA: NEGATIVE
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
## 2020-12-14 NOTE — Addendum Note (Signed)
Addended by: Drenda Freeze on: 12/14/2020 09:42 AM   Modules accepted: Orders
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
## 2020-12-18 ENCOUNTER — Other Ambulatory Visit: Payer: Self-pay

## 2020-12-18 ENCOUNTER — Other Ambulatory Visit
Admission: RE | Admit: 2020-12-18 | Discharge: 2020-12-18 | Disposition: A | Discharge status: Home / Self Care | Payer: Medicaid Other | Source: Ambulatory Visit | Attending: Obstetrics & Gynecology | Admitting: Obstetrics & Gynecology

## 2020-12-18 DIAGNOSIS — Z20822 Contact with and (suspected) exposure to covid-19: Secondary | ICD-10-CM | POA: Insufficient documentation

## 2020-12-18 DIAGNOSIS — Z01812 Encounter for preprocedural laboratory examination: Secondary | ICD-10-CM | POA: Insufficient documentation

## 2020-12-18 LAB — SARS CORONAVIRUS 2 (TAT 6-24 HRS): SARS Coronavirus 2: NEGATIVE

## 2020-12-19 ENCOUNTER — Inpatient Hospital Stay: Payer: Medicaid Other | Admitting: Anesthesiology

## 2020-12-19 ENCOUNTER — Inpatient Hospital Stay
Admission: EM | Admit: 2020-12-19 | Discharge: 2020-12-21 | DRG: 807 | Disposition: A | Discharge status: Home / Self Care | Payer: Medicaid Other | Attending: Advanced Practice Midwife | Admitting: Advanced Practice Midwife

## 2020-12-19 DIAGNOSIS — Z3490 Encounter for supervision of normal pregnancy, unspecified, unspecified trimester: Secondary | ICD-10-CM | POA: Diagnosis present

## 2020-12-19 DIAGNOSIS — O9952 Diseases of the respiratory system complicating childbirth: Secondary | ICD-10-CM | POA: Diagnosis present

## 2020-12-19 DIAGNOSIS — J4520 Mild intermittent asthma, uncomplicated: Secondary | ICD-10-CM | POA: Diagnosis present

## 2020-12-19 DIAGNOSIS — Z20822 Contact with and (suspected) exposure to covid-19: Secondary | ICD-10-CM | POA: Diagnosis present

## 2020-12-19 DIAGNOSIS — Z3A39 39 weeks gestation of pregnancy: Secondary | ICD-10-CM

## 2020-12-19 DIAGNOSIS — O26893 Other specified pregnancy related conditions, third trimester: Secondary | ICD-10-CM | POA: Diagnosis present

## 2020-12-19 DIAGNOSIS — O99214 Obesity complicating childbirth: Secondary | ICD-10-CM | POA: Diagnosis present

## 2020-12-19 HISTORY — DX: Malignant (primary) neoplasm, unspecified: C80.1

## 2020-12-19 LAB — CBC
HCT: 37.7 % (ref 36.0–46.0)
Hemoglobin: 12.2 g/dL (ref 12.0–15.0)
MCH: 29.6 pg (ref 26.0–34.0)
MCHC: 32.4 g/dL (ref 30.0–36.0)
MCV: 91.5 fL (ref 80.0–100.0)
Platelets: 288 10*3/uL (ref 150–400)
RBC: 4.12 MIL/uL (ref 3.87–5.11)
RDW: 13.2 % (ref 11.5–15.5)
WBC: 9.4 10*3/uL (ref 4.0–10.5)
nRBC: 0 % (ref 0.0–0.2)

## 2020-12-19 LAB — TYPE AND SCREEN
ABO/RH(D): A POS
Antibody Screen: NEGATIVE

## 2020-12-19 LAB — RPR: RPR Ser Ql: NONREACTIVE

## 2020-12-19 MED ORDER — MISOPROSTOL 25 MCG QUARTER TABLET
25.0000 ug | ORAL_TABLET | ORAL | Status: DC | PRN
  Administered 2020-12-19 (×2): 25 ug via VAGINAL
  Filled 2020-12-19 (×2): qty 1

## 2020-12-19 MED ORDER — ONDANSETRON HCL 4 MG/2ML IJ SOLN: 4.0000 mg | INTRAMUSCULAR | Status: DC | PRN

## 2020-12-19 MED ORDER — CALCIUM CARBONATE ANTACID 500 MG PO CHEW
400.0000 mg | CHEWABLE_TABLET | Freq: Three times a day (TID) | ORAL | Status: DC
  Administered 2020-12-19: 400 mg via ORAL

## 2020-12-19 MED ORDER — MISOPROSTOL 200 MCG PO TABS
ORAL_TABLET | ORAL | Status: AC
  Filled 2020-12-19: qty 4

## 2020-12-19 MED ORDER — ONDANSETRON HCL 4 MG/2ML IJ SOLN
4.0000 mg | Freq: Four times a day (QID) | INTRAMUSCULAR | Status: DC | PRN
  Administered 2020-12-19 (×2): 4 mg via INTRAVENOUS
  Filled 2020-12-19 (×2): qty 2

## 2020-12-19 MED ORDER — PHENYLEPHRINE 40 MCG/ML (10ML) SYRINGE FOR IV PUSH (FOR BLOOD PRESSURE SUPPORT): 80.0000 ug | PREFILLED_SYRINGE | INTRAVENOUS | Status: DC | PRN

## 2020-12-19 MED ORDER — EPHEDRINE 5 MG/ML INJ: 10.0000 mg | INTRAVENOUS | Status: DC | PRN

## 2020-12-19 MED ORDER — FENTANYL-BUPIVACAINE-NACL 0.5-0.125-0.9 MG/250ML-% EP SOLN
EPIDURAL | Status: AC
  Filled 2020-12-19: qty 250

## 2020-12-19 MED ORDER — TERBUTALINE SULFATE 1 MG/ML IJ SOLN
0.2500 mg | Freq: Once | INTRAMUSCULAR | Status: AC | PRN
  Administered 2020-12-20: 0.25 mg via SUBCUTANEOUS
  Filled 2020-12-19: qty 1

## 2020-12-19 MED ORDER — OXYTOCIN BOLUS FROM INFUSION
333.0000 mL | Freq: Once | INTRAVENOUS | Status: DC
  Administered 2020-12-20: 333 mL via INTRAVENOUS

## 2020-12-19 MED ORDER — ACETAMINOPHEN 325 MG PO TABS: 650.0000 mg | ORAL_TABLET | ORAL | Status: DC | PRN

## 2020-12-19 MED ORDER — LACTATED RINGERS IV SOLN
500.0000 mL | Freq: Once | INTRAVENOUS | Status: AC
  Administered 2020-12-20: 500 mL via INTRAVENOUS

## 2020-12-19 MED ORDER — LIDOCAINE-EPINEPHRINE (PF) 1.5 %-1:200000 IJ SOLN
INTRAMUSCULAR | Status: DC | PRN
  Administered 2020-12-19: 3 mL via EPIDURAL

## 2020-12-19 MED ORDER — LIDOCAINE HCL (PF) 1 % IJ SOLN
INTRAMUSCULAR | Status: DC | PRN
  Administered 2020-12-19: 3 mL via SUBCUTANEOUS
  Administered 2020-12-19: 2 mL via SUBCUTANEOUS

## 2020-12-19 MED ORDER — AMMONIA AROMATIC IN INHA
RESPIRATORY_TRACT | Status: AC
  Filled 2020-12-19: qty 10

## 2020-12-19 MED ORDER — OXYTOCIN-SODIUM CHLORIDE 30-0.9 UT/500ML-% IV SOLN
2.5000 [IU]/h | INTRAVENOUS | Status: DC
  Administered 2020-12-20: 2.5 [IU]/h via INTRAVENOUS
  Filled 2020-12-19: qty 1000

## 2020-12-19 MED ORDER — OXYTOCIN 10 UNIT/ML IJ SOLN
INTRAMUSCULAR | Status: AC
  Filled 2020-12-19: qty 2

## 2020-12-19 MED ORDER — LACTATED RINGERS IV SOLN: INTRAVENOUS | Status: DC

## 2020-12-19 MED ORDER — LACTATED RINGERS IV SOLN
500.0000 mL | INTRAVENOUS | Status: DC | PRN
  Administered 2020-12-19 – 2020-12-20 (×3): 500 mL via INTRAVENOUS

## 2020-12-19 MED ORDER — LIDOCAINE HCL (PF) 1 % IJ SOLN
INTRAMUSCULAR | Status: AC
  Filled 2020-12-19: qty 30

## 2020-12-19 MED ORDER — LIDOCAINE HCL (PF) 1 % IJ SOLN: 30.0000 mL | INTRAMUSCULAR | Status: DC | PRN

## 2020-12-19 MED ORDER — SODIUM CHLORIDE 0.9 % IV SOLN
INTRAVENOUS | Status: DC | PRN
  Administered 2020-12-19 (×2): 5 mL via EPIDURAL

## 2020-12-19 MED ORDER — BUTORPHANOL TARTRATE 1 MG/ML IJ SOLN
1.0000 mg | INTRAMUSCULAR | Status: DC | PRN
  Administered 2020-12-19: 1 mg via INTRAVENOUS
  Filled 2020-12-19: qty 1

## 2020-12-19 MED ORDER — DIPHENHYDRAMINE HCL 50 MG/ML IJ SOLN: 12.5000 mg | INTRAMUSCULAR | Status: DC | PRN

## 2020-12-19 MED ORDER — OXYTOCIN-SODIUM CHLORIDE 30-0.9 UT/500ML-% IV SOLN
1.0000 m[IU]/min | INTRAVENOUS | Status: DC
  Administered 2020-12-19: 2 m[IU]/min via INTRAVENOUS

## 2020-12-19 MED ORDER — FENTANYL-BUPIVACAINE-NACL 0.5-0.125-0.9 MG/250ML-% EP SOLN
12.0000 mL/h | EPIDURAL | Status: DC | PRN
  Administered 2020-12-19: 12 mL/h via EPIDURAL

## 2020-12-19 NOTE — Progress Notes (Signed)
  Labor Progress Note   30 y.o. P2R5188 @ [redacted]w[redacted]d , admitted for  Pregnancy, Labor Management. Elective induction of labor  Subjective:  Felt some contractions with first dose of cytotec and then fell asleep.  Objective:  BP (!) 106/49 (BP Location: Left Arm)   Pulse 84   Temp 97.9 F (36.6 C) (Oral)   Resp 18   Ht 5' 7.5" (1.715 m)   Wt 130.6 kg   LMP  (LMP Unknown)   BMI 44.44 kg/m  Abd: gravid, ND, FHT present, mild tenderness on exam Extr: trace to 1+ bilateral pedal edema SVE: CERVIX: 1 cm dilated, 60 effaced, -3 station, midline, cervical sweep  EFM: FHR: 140 bpm, variability: moderate,  accelerations:  Present,  decelerations:  Absent Toco: Frequency: irregular Labs: I have reviewed the patient's lab results.   Assessment & Plan:  [id number] @ [redacted]w[redacted]d, admitted for  Pregnancy and Labor/Delivery Management  1. Pain management: none. 2. FWB: FHT category I.  3. ID: GBS negative 4. Labor management: s/p second dose of cytotec  All discussed with patient, see orders   Rod Can, [facility] 12/19/2020  10:50 AM

## 2020-12-19 NOTE — Progress Notes (Signed)
  Labor Progress Note   30 y.o. [id number] @ [redacted]w[redacted]d , admitted for  Pregnancy, Labor Management. Elective induction of labor  Subjective:  Comfortable with epidural  Objective:  BP 108/64   Pulse 82   Temp 98.5 F (36.9 C) (Oral)   Resp 18   Ht 5' 7.5" (1.715 m)   Wt 130.6 kg   LMP  (LMP Unknown)   BMI 44.44 kg/m  Abd: gravid, ND, FHT present, mild tenderness on exam Extr: trace to 1+ bilateral pedal edema SVE: CERVIX: 2 cm dilated, 60 effaced, -3 station Foley catheter placed with 40 ml NS, bloody show  EFM: FHR: 125 bpm, variability: moderate,  accelerations:  Present,  decelerations:  Absent Toco: Frequency: Every 2-3 minutes with occasional gaps to 4 minutes Labs: I have reviewed the patient's lab results.   Assessment & Plan:  [id number] @ [redacted]w[redacted]d, admitted for  Pregnancy and Labor/Delivery Management  1. Pain management: epidural. 2. FWB: FHT category I.  3. ID: GBS negative 4. Labor management: s/p foley catheter, continue pitocin  All discussed with patient, see orders   Rod Can, [facility] 12/19/2020  10:24 PM

## 2020-12-19 NOTE — H&P (Signed)
History and Physical Interval Note:  12/19/2020 7:19 AM  Ashley Moyer  is a 30 yo G3P2002 at 39 weeks who has presented today for INDUCTION OF LABOR (cervical ripening agents),  with the diagnosis of Favorable cervix at term. The various methods of treatment have been discussed with the patient and family. After consideration of risks, benefits and other options for treatment, the patient has consented to  Labor induction .  The patient's history has been reviewed, patient examined, no change in status, and is stable for induction as planned.  See H&P. I have reviewed the patient's chart and labs.  Questions were answered to the patient's satisfaction.    Barnett Applebaum, MD, [name], [facility] 12/19/2020  7:19 AM

## 2020-12-19 NOTE — Anesthesia Procedure Notes (Signed)
Epidural Patient location during procedure: OB Start time: 12/19/2020 7:04 PM End time: 12/19/2020 7:20 PM  Staffing Anesthesiologist: Martha Clan, MD Performed: anesthesiologist   Preanesthetic Checklist Completed: patient identified, IV checked, site marked, risks and benefits discussed, surgical consent, monitors and equipment checked, pre-op evaluation and timeout performed  Epidural Patient position: sitting Prep: ChloraPrep Patient monitoring: heart rate, continuous pulse ox and blood pressure Approach: midline Location: L3-L4 Injection technique: LOR saline  Needle:  Needle type: Tuohy  Needle gauge: 17 G Needle length: 9 cm and 9 Needle insertion depth: 8 cm Catheter type: closed end flexible Catheter size: 19 Gauge Catheter at skin depth: 13 cm Test dose: negative and 1.5% lidocaine with Epi 1:200 K  Assessment Sensory level: T10 Events: blood not aspirated, injection not painful, no injection resistance, no paresthesia and negative IV test  Additional Notes 1st attempt Pt. Evaluated and documentation done after procedure finished. Patient identified. Risks/Benefits/Options discussed with patient including but not limited to bleeding, infection, nerve damage, paralysis, failed block, incomplete pain control, headache, blood pressure changes, nausea, vomiting, reactions to medication both or allergic, itching and postpartum back pain. Confirmed with bedside nurse the patient's most recent platelet count. Confirmed with patient that they are not currently taking any anticoagulation, have any bleeding history or any family history of bleeding disorders. Patient expressed understanding and wished to proceed. All questions were answered. Sterile technique was used throughout the entire procedure. Please see nursing notes for vital signs. Test dose was given through epidural catheter and negative prior to continuing to dose epidural or start infusion. Warning signs of high  block given to the patient including shortness of breath, tingling/numbness in hands, complete motor block, or any concerning symptoms with instructions to call for help. Patient was given instructions on fall risk and not to get out of bed. All questions and concerns addressed with instructions to call with any issues or inadequate analgesia.    Patient tolerated the insertion well without immediate complications.Reason for block:post-op pain management and procedure for pain

## 2020-12-19 NOTE — Anesthesia Preprocedure Evaluation (Signed)
Anesthesia Evaluation  Patient identified by MRN, date of birth, ID band Patient awake    Reviewed: Allergy & Precautions, NPO status , Patient's Chart, lab work & pertinent test results  History of Anesthesia Complications Negative for: history of anesthetic complications  Airway Mallampati: III       Dental no notable dental hx.    Pulmonary neg shortness of breath, asthma , neg sleep apnea, neg COPD, neg recent URI, Current Smoker and Patient abstained from smoking., former smoker,           Cardiovascular Exercise Tolerance: Good (-) hypertension(-) Past MI and (-) CHF (-) dysrhythmias (-) Valvular Problems/Murmurs     Neuro/Psych  Headaches, neg Seizures PSYCHIATRIC DISORDERS Anxiety Depression    GI/Hepatic Neg liver ROS, neg GERD  ,  Endo/Other  neg diabetesMorbid obesity  Renal/GU negative Renal ROS     Musculoskeletal   Abdominal   Peds  Hematology   Anesthesia Other Findings Past Medical History: No date: Arthritis     Comment:  KNEE No date: Asthma     Comment:  WELL CONTROLLED No date: Borderline personality disorder ([place]) No date: Cancer ([place]) No date: Depression No date: Headache(784.0) No date: PTSD (post-traumatic stress disorder)   Reproductive/Obstetrics (+) Pregnancy                             Anesthesia Physical  Anesthesia Plan  ASA: 3  Anesthesia Plan: Epidural   Post-op Pain Management:    Induction:   PONV Risk Score and Plan:   Airway Management Planned:   Additional Equipment:   Intra-op Plan:   Post-operative Plan:   Informed Consent: I have reviewed the patients History and Physical, chart, labs and discussed the procedure including the risks, benefits and alternatives for the proposed anesthesia with the patient or authorized representative who has indicated his/her understanding and acceptance.       Plan Discussed with:    Anesthesia Plan Comments:         Anesthesia Quick Evaluation

## 2020-12-20 ENCOUNTER — Encounter: Payer: Self-pay | Admitting: Obstetrics & Gynecology

## 2020-12-20 DIAGNOSIS — Z3A39 39 weeks gestation of pregnancy: Secondary | ICD-10-CM | POA: Diagnosis not present

## 2020-12-20 MED ORDER — ONDANSETRON HCL 4 MG PO TABS: 4.0000 mg | ORAL_TABLET | ORAL | Status: DC | PRN

## 2020-12-20 MED ORDER — BENZOCAINE-MENTHOL 20-0.5 % EX AERO
1.0000 "application " | INHALATION_SPRAY | CUTANEOUS | Status: DC | PRN
  Administered 2020-12-20: 1 via TOPICAL
  Filled 2020-12-20: qty 56

## 2020-12-20 MED ORDER — SIMETHICONE 80 MG PO CHEW: 80.0000 mg | CHEWABLE_TABLET | ORAL | Status: DC | PRN

## 2020-12-20 MED ORDER — DIPHENHYDRAMINE HCL 25 MG PO CAPS: 25.0000 mg | ORAL_CAPSULE | Freq: Four times a day (QID) | ORAL | Status: DC | PRN

## 2020-12-20 MED ORDER — ONDANSETRON HCL 4 MG/2ML IJ SOLN: 4.0000 mg | INTRAMUSCULAR | Status: DC | PRN

## 2020-12-20 MED ORDER — TETANUS-DIPHTH-ACELL PERTUSSIS 5-2.5-18.5 LF-MCG/0.5 IM SUSY: 0.5000 mL | PREFILLED_SYRINGE | Freq: Once | INTRAMUSCULAR | Status: DC

## 2020-12-20 MED ORDER — COCONUT OIL OIL
1.0000 "application " | TOPICAL_OIL | Status: DC | PRN
  Administered 2020-12-20: 1 via TOPICAL
  Filled 2020-12-20: qty 120

## 2020-12-20 MED ORDER — DIBUCAINE (PERIANAL) 1 % EX OINT: 1.0000 "application " | TOPICAL_OINTMENT | CUTANEOUS | Status: DC | PRN

## 2020-12-20 MED ORDER — PRENATAL MULTIVITAMIN CH
1.0000 | ORAL_TABLET | Freq: Every day | ORAL | Status: DC
  Filled 2020-12-20 (×2): qty 1

## 2020-12-20 MED ORDER — IBUPROFEN 600 MG PO TABS: 600.0000 mg | ORAL_TABLET | Freq: Four times a day (QID) | ORAL | Status: DC

## 2020-12-20 MED ORDER — WITCH HAZEL-GLYCERIN EX PADS
1.0000 "application " | MEDICATED_PAD | CUTANEOUS | Status: DC | PRN
  Administered 2020-12-20: 1 via TOPICAL
  Filled 2020-12-20: qty 100

## 2020-12-20 MED ORDER — DOCUSATE SODIUM 100 MG PO CAPS
100.0000 mg | ORAL_CAPSULE | Freq: Two times a day (BID) | ORAL | Status: DC
  Administered 2020-12-21: 100 mg via ORAL
  Filled 2020-12-20: qty 1

## 2020-12-20 MED ORDER — OXYCODONE HCL 5 MG PO TABS
5.0000 mg | ORAL_TABLET | ORAL | Status: DC | PRN
  Administered 2020-12-20 – 2020-12-21 (×2): 5 mg via ORAL
  Filled 2020-12-20 (×2): qty 1

## 2020-12-20 MED ORDER — OXYCODONE HCL 5 MG PO TABS
10.0000 mg | ORAL_TABLET | ORAL | Status: DC | PRN
  Administered 2020-12-20: 10 mg via ORAL
  Filled 2020-12-20: qty 2

## 2020-12-20 MED ORDER — ACETAMINOPHEN 325 MG PO TABS
650.0000 mg | ORAL_TABLET | ORAL | Status: DC | PRN
  Administered 2020-12-20 – 2020-12-21 (×3): 650 mg via ORAL
  Filled 2020-12-20 (×3): qty 2

## 2020-12-20 MED ORDER — ZOLPIDEM TARTRATE 5 MG PO TABS: 5.0000 mg | ORAL_TABLET | Freq: Every evening | ORAL | Status: DC | PRN

## 2020-12-20 NOTE — Progress Notes (Addendum)
  Labor Progress Note   30 y.o. Y6M6004 @ [redacted]w[redacted]d , admitted for  Pregnancy, Labor Management. Elective induction  Subjective:  Feeling some pressure  Objective:  BP 102/69 (BP Location: Left Wrist)   Pulse 70   Temp 98.3 F (36.8 C) (Oral)   Resp 16   Ht 5' 7.5" (1.715 m)   Wt 130.6 kg   LMP  (LMP Unknown)   SpO2 95%   BMI 44.44 kg/m  Abd: gravid, ND, FHT present, mild tenderness on exam Extr: trace to 1+ bilateral pedal edema SVE: CERVIX: 6 cm dilated, 90 effaced, -2 station AROM yellow (possible meconium)/blood tinged moderate amount IUPC/FSE placed  EFM: FHR: 100 bpm, variability: moderate,  accelerations:  Present,  decelerations:  Present early decelerations to 60s Toco: Frequency: Every 2-3 minutes Labs: I have reviewed the patient's lab results.   Assessment & Plan:  [id number] @ [redacted]w[redacted]d, admitted for  Pregnancy and Labor/Delivery Management  1. Pain management: epidural. 2. FWB: FHT category II.  3. ID: GBS negative 4. Labor management: pitocin off, position changes, fluid bolus, terbutaline Dr Kenton Kingfisher notified  All discussed with patient, see orders  Rod Can, [facility] 12/20/2020  6:55 AM  ADDENDUM: FHR Tracing reviewed.    With Pitocin off and Terb one dose, the baseline has improved to 110-120 bpm and the early to late decels have stopped AROM successful as well Discussed plan of management with patient Plan to see if she can progress to complete dilation soon, then proceed w second stage w assistance as necessary Epidural in place Option for CS if recurrent decels resume discussed  Barnett Applebaum, MD, [name], [facility] 12/20/2020  7:19 AM   .

## 2020-12-20 NOTE — Discharge Summary (Signed)
Postpartum Discharge Summary  Date of Service updated11/11/2020     Patient Name: Ashley Moyer DOB: 05-11-1990 MRN: [id number]  Date of admission: 12/19/2020 Delivery date:12/20/2020  Delivering provider: [name]  Date of discharge: 12/21/2020  Admitting diagnosis: Encounter for elective induction of labor [Z34.90] Intrauterine pregnancy: [redacted]w[redacted]d    Secondary diagnosis:  Active Problems:   Normal vaginal delivery   Postpartum care following vaginal delivery  Additional problems: none    Discharge diagnosis: Term Pregnancy Delivered                                              Post partum procedures: none Augmentation: AROM, Pitocin, and Cytotec Complications: None  Hospital course: Induction of Labor With Vaginal Delivery   30y.o. yo G3P2002 at 353w2das admitted to the hospital 12/19/2020 for induction of labor.  Indication for induction: Elective.  Patient had an uncomplicated labor course as follows: Membrane Rupture Time/Date: 6:31 AM ,12/20/2020   Delivery Method:Vaginal, Spontaneous  Episiotomy: None  Lacerations:  None  Details of delivery can be found in separate delivery note.  Patient had a routine postpartum course. Patient is discharged home 12/21/20.  Newborn Data: Birth date:12/20/2020  Birth time:8:25 AM  Gender:Female  Living status:Living  Apgars:9 ,9  Weight:2860 g   Magnesium Sulfate received: No BMZ received: No Rhophylac:N/A MMR:No T-DaP:Given prenatally Flu: No Transfusion:No  Physical exam  Vitals:   12/20/20 1538 12/20/20 1917 12/20/20 2341 12/21/20 0740  BP: 127/76 119/66 123/77 116/69  Pulse: 63 72 64 68  Resp: _0 Temp: 98.9 F (37.2 C) 97.8 F (36.6 C) 98 F (36.7 C) 98.2 F (36.8 C)  TempSrc: Oral Oral Oral Oral  SpO2:  97% 98% 98%  Weight:      Height:       General: alert, cooperative, and no distress Lochia: appropriate Uterine Fundus: firm Incision: N/A DVT Evaluation: No evidence of DVT  seen on physical exam. Negative Homan's sign. Labs: Lab Results  Component Value Date   WBC 11.7 (H) 12/21/2020   HGB 10.2 (L) 12/21/2020   HCT 30.6 (L) 12/21/2020   MCV 91.9 12/21/2020   PLT 247 12/21/2020   CMP Latest Ref Rng & Units 12/30/2018  Glucose 70 - 99 mg/dL 106(H)  BUN 6 - 20 mg/dL 15  Creatinine 0.44 - 1.00 mg/dL 0.90  Sodium 135 - 145 mmol/L 139  Potassium 3.5 - 5.1 mmol/L 3.8  Chloride 98 - 111 mmol/L 107  CO2 22 - 32 mmol/L 24  Calcium 8.9 - 10.3 mg/dL 8.9  Total Protein 6.5 - 8.1 g/dL 6.9  Total Bilirubin 0.3 - 1.2 mg/dL 0.5  Alkaline Phos 38 - 126 U/L 64  AST 15 - 41 U/L 21  ALT 0 - 44 U/L 27   Edinburgh Score: Edinburgh Postnatal Depression Scale Screening Tool 12/20/2020  I have been able to laugh and see the funny side of things. 0  I have looked forward with enjoyment to things. 0  I have blamed myself unnecessarily when things went wrong. 1  I have been anxious or worried for no good reason. 1  I have felt scared or panicky for no good reason. 1  Things have been getting on top of me. 0  I have been so unhappy that I have had difficulty sleeping. 0  I have  felt sad or miserable. 0  I have been so unhappy that I have been crying. 0  The thought of harming myself has occurred to me. 0  Edinburgh Postnatal Depression Scale Total 3      After visit meds:  Allergies as of 12/21/2020       Reactions   Ibuprofen Nausea And Vomiting   Tape Other (See Comments)   SURGICAL-RASH  Paper tape OK        Medication List     STOP taking these medications    aspirin EC 81 MG tablet   promethazine 25 MG tablet Commonly known as: PHENERGAN   valACYclovir 500 MG tablet Commonly known as: VALTREX       TAKE these medications    nystatin cream Commonly known as: MYCOSTATIN Apply 1 application topically 2 (two) times daily. Apply to umbilicus.   omeprazole 10 MG capsule Commonly known as: PRILOSEC Take 10 mg by mouth daily.   oxyCODONE 5  MG immediate release tablet Commonly known as: Oxy IR/ROXICODONE Take 1 tablet (5 mg total) by mouth every 4 (four) hours as needed (pain scale 4-7).   PRENATAL GUMMIES PO Take by mouth.   ProAir HFA 108 (90 Base) MCG/ACT inhaler Generic drug: albuterol Inhale into the lungs.         Discharge home in stable condition Infant Feeding: Breast Infant Disposition:home with mother Discharge instruction: per After Visit Summary and Postpartum booklet. Activity: Advance as tolerated. Pelvic rest for 6 weeks.  Diet: routine diet Anticipated Birth Control: Plans Interval BTL. Has one tube and plasns for ligation with [facility] Postpartum Appointment:6 weeks Additional Postpartum F/U: Postpartum Depression checkup Future Appointments: Future Appointments  Date Time Provider [place]  12/31/2020 10:40 AM Gae Dry, MD [facility] None   Follow up Visit:  Follow-up Information     Gae Dry, MD. Go to.   Specialty: Obstetrics and Gynecology Why: Please keep your appointtment with Dr. Kenton Kingfisher, when you will discuss having your tubal (interval).You may also make an appointment for a 6 week postpartum check up with Dr. Kenton Kingfisher or Gigi Gin, nurse Midwife. Contact information: [place] [address] [phone]                     12/21/2020 Imagene Riches, CNM

## 2020-12-20 NOTE — Progress Notes (Signed)
Pt called out requesting formula for infant. Nurse went in and educated on supplementation risks. Mother still requested formula and stated she came in with a breast/bottle choice and did this with her other children.   Nurse also educated on proper use of formula and provided bottles, nipples, and formula. Encouraged mother to be sure and offer the breast first each time for a feed.

## 2020-12-20 NOTE — Lactation Note (Signed)
This note was copied from a baby's chart. Went in to assess lactation needs. Mom reports she is only getting a small amount of milk so she wants to offer formula. Reviewed with parents that infant's stomach is very small and that colostrum is concentrated nutrition so large amounts are not needed. Instructed parents to always offer the breast first and to only offer 10-22ml after feeding and that offering too much may cause infant to spit. Parents stated that last feeding infant did not go to breast and they offered 30-6ml. Infant has spit more than three times before RN entered and continued some spitting while in the room. Parents verbalized their understanding of need to limit feedings for infant's benefit and that we needed her to go to the breast first each feeding time. Assisted with cleaning infant up after a spit and using bulb syringe.

## 2020-12-20 NOTE — Discharge Instructions (Signed)
Discharge Instructions:   Follow-up Appointment: Call and schedule a 6-week follow-up appointment with Gigi Gin, CNM at [facility]   If there are any new medications, they have been ordered and will be available for pickup at the listed pharmacy on your way home from the hospital.   Call office if you have any of the following: headache, visual changes, fever >101.0 F, chills, shortness of breath, breast concerns, excessive vaginal bleeding, incision drainage or problems, leg pain or redness, depression or any other concerns. If you have vaginal discharge with an odor, let your doctor know.   It is normal to bleed for up to 6 weeks. You should not soak through more than 1 pad in 1 hour. If you have a blood clot larger than your fist with continued bleeding, call your doctor.   Activity: Do not lift > 10 lbs for 6 weeks (do not lift anything heavier than your baby). No intercourse, tampons, swimming pools, hot tubs, baths (only showers) for 6 weeks.  No driving for 1-2 weeks. Continue prenatal vitamin, especially if breastfeeding. Increase calories and fluids (water) while breastfeeding.   Your milk will come in, in the next couple of days (right now it is colostrum). You may have a slight fever when your milk comes in, but it should go away on its own.  If it does not, and rises above 101 F please call the doctor. You will also feel achy and your breasts will be firm. They will also start to leak. If you are breastfeeding, continue as you have been and you can pump/express milk for comfort.   If you have too much milk, your breasts can become engorged, which could lead to mastitis. This is an infection of the milk ducts. It can be very painful and you will need to notify your doctor to obtain a prescription for antibiotics. You can also treat it with a shower or hot/cold compress.   For concerns about your baby, please call your pediatrician.  For breastfeeding concerns, the lactation  consultant can be reached at [phone].   Postpartum blues (feelings of happy one minute and sad another minute) are normal for the first few weeks but if it gets worse let your doctor know.   Congratulations! We enjoyed caring for you and your new bundle of joy!

## 2020-12-21 LAB — CBC
HCT: 30.6 % — ABNORMAL LOW (ref 36.0–46.0)
Hemoglobin: 10.2 g/dL — ABNORMAL LOW (ref 12.0–15.0)
MCH: 30.6 pg (ref 26.0–34.0)
MCHC: 33.3 g/dL (ref 30.0–36.0)
MCV: 91.9 fL (ref 80.0–100.0)
Platelets: 247 10*3/uL (ref 150–400)
RBC: 3.33 MIL/uL — ABNORMAL LOW (ref 3.87–5.11)
RDW: 13.4 % (ref 11.5–15.5)
WBC: 11.7 10*3/uL — ABNORMAL HIGH (ref 4.0–10.5)
nRBC: 0 % (ref 0.0–0.2)

## 2020-12-21 MED ORDER — OXYCODONE HCL 5 MG PO TABS: 5.0000 mg | ORAL_TABLET | ORAL | 0 refills | Status: DC | PRN

## 2020-12-21 NOTE — Progress Notes (Signed)
Patient discharged home with family.  Discharge instructions, when to follow up, and prescriptions reviewed with patient.  Patient verbalized understanding. Patient will be escorted out by auxiliary.   

## 2020-12-21 NOTE — Anesthesia Postprocedure Evaluation (Signed)
Anesthesia Post Note  Patient: KAEDEN DEPAZ  Procedure(s) Performed: AN AD HOC LABOR EPIDURAL  Patient location during evaluation: Mother Baby Anesthesia Type: Epidural Level of consciousness: awake and alert Pain management: pain level controlled Vital Signs Assessment: post-procedure vital signs reviewed and stable Respiratory status: spontaneous breathing, nonlabored ventilation and respiratory function stable Cardiovascular status: stable Postop Assessment: no headache, no backache and epidural receding Anesthetic complications: no   No notable events documented.   Last Vitals:  Vitals:   12/20/20 1917 12/20/20 2341  BP: 119/66 123/77  Pulse: 72 64  Resp: 20 18  Temp: 36.6 C 36.7 C  SpO2: 97% 98%    Last Pain:                 Brantley Fling

## 2020-12-24 NOTE — Interval H&P Note (Signed)
History and Physical Interval Note: History and Physical Interval Note:  12/24/2020 5:18 PM  Ashley Moyer  has presented today for INDUCTION OF LABOR (cervical ripening agents),  with the diagnosis of Favorable cervix at term. The various methods of treatment have been discussed with the patient and family. After consideration of risks, benefits and other options for treatment, the patient has consented to  Labor induction .  The patient's history has been reviewed, patient examined, no change in status, and is stable for induction as planned.  See H&P. I have reviewed the patient's chart and labs.  Questions were answered to the patient's satisfaction.    Barnett Applebaum, MD, [name], [facility] 12/24/2020  5:18 PM     Hoyt Koch

## 2020-12-31 ENCOUNTER — Ambulatory Visit (INDEPENDENT_AMBULATORY_CARE_PROVIDER_SITE_OTHER): Payer: Medicaid Other | Admitting: Obstetrics & Gynecology

## 2020-12-31 ENCOUNTER — Other Ambulatory Visit: Payer: Self-pay

## 2020-12-31 ENCOUNTER — Encounter: Payer: Self-pay | Admitting: Obstetrics & Gynecology

## 2020-12-31 VITALS — BP 120/80 | Ht 67.5 in | Wt 273.0 lb

## 2020-12-31 DIAGNOSIS — Z3009 Encounter for other general counseling and advice on contraception: Secondary | ICD-10-CM

## 2020-12-31 NOTE — Progress Notes (Signed)
  Contraception Counseling Patient presents for contraception counseling. The patient has no complaints today. The patient is not currently sexually active. She had recent vaginal delivery 2 weeks ago.  Planning for permanent sterilization.  She has had prior left oophorectomy.  Pertinent past medical history: none.  PMHx: She  has a past medical history of Arthritis, Asthma, Borderline personality disorder ([place]), Cancer ([place]), Depression, Headache(784.0), and PTSD (post-traumatic stress disorder). Also,  has a past surgical history that includes Cholecystectomy; Wisdom tooth extraction; and Laparoscopic ovarian cystectomy (N/A, 08/31/2018)., family history includes Cancer in her maternal aunt, maternal grandmother, and mother.,  reports that she quit smoking about 7 months ago. Her smoking use included cigarettes. She has a 2.25 pack-year smoking history. She has never used smokeless tobacco. She reports that she does not currently use alcohol. She reports that she does not use drugs.  She has a current medication list which includes the following prescription(s): nystatin cream, omeprazole, prenatal mv & min w/fa-dha, proair hfa, and oxycodone, and the following Facility-Administered Medications: clotrimazole. Also, is allergic to ibuprofen and tape.  Review of Systems  Constitutional:  Negative for chills, fever and malaise/fatigue.  HENT:  Negative for congestion, sinus pain and sore throat.   Eyes:  Negative for blurred vision and pain.  Respiratory:  Negative for cough and wheezing.   Cardiovascular:  Negative for chest pain and leg swelling.  Gastrointestinal:  Negative for abdominal pain, constipation, diarrhea, heartburn, nausea and vomiting.  Genitourinary:  Negative for dysuria, frequency, hematuria and urgency.  Musculoskeletal:  Negative for back pain, joint pain, myalgias and neck pain.  Skin:  Negative for itching and rash.  Neurological:  Negative for dizziness, tremors and weakness.   Endo/Heme/Allergies:  Does not bruise/bleed easily.  Psychiatric/Behavioral:  Negative for depression. The patient is not nervous/anxious and does not have insomnia.    Objective: BP 120/80   Ht 5' 7.5" (1.715 m)   Wt 273 lb (123.8 kg)   Breastfeeding Yes   BMI 42.13 kg/m  Physical Exam Constitutional:      General: She is not in acute distress.    Appearance: She is well-developed.  Musculoskeletal:        General: Normal range of motion.  Neurological:     Mental Status: She is alert and oriented to person, place, and time.  Skin:    General: Skin is warm and dry.  Vitals reviewed.    ASSESSMENT/PLAN:  1. Sterilization consult The patient has been fully informed about all methods of contraception, both temporary and permanent. She understands that tubal ligation is meant to be permanent, absolute and irreversible. She was told that there is an approximately 1 in 400 chance of a pregnancy in the future after tubal ligation. She was told the short and long term complications of tubal ligation. She understands the risks from this surgery include, but are not limited to, the risks of anesthesia, hemorrhage, infection, perforation, and injury to adjacent structures, bowel, bladder and blood vessels.   Pt also doing well 2 weeks post partum. No s/sx PPD.  Barnett Applebaum, MD, [name], [facility] 12/31/2020  10:32 AM

## 2020-12-31 NOTE — Patient Instructions (Signed)
Postpartum Tubal Ligation, Care After The following information offers guidance on how to care for yourself after your procedure. Your health care provider may also give you more specific instructions. If you have problems or questions, contact your health care provider. What can I expect after the procedure? After the procedure, you may have: A sore throat if general anesthesia was used. Bruising or pain in your back. Nausea or vomiting. Dizziness. Mild abdominal discomfort or pain, such as cramping, gas pain, or feeling bloated. Soreness around the incision area. Tiredness. Pain in your shoulders. This is caused by the gas that was used during the procedure. Follow these instructions at home: Medicines Ask your health care provider if the medicine prescribed to you: Requires you to avoid driving or using heavy machinery. Can cause constipation. You may need to take these actions to prevent or treat constipation: Drink enough fluid to keep your urine pale yellow. Take over-the-counter or prescription medicines. Eat foods that are high in fiber, such as beans, whole grains, and fresh fruits and vegetables. Limit foods that are high in fat and processed sugars, such as fried or sweet foods. Do not take aspirin because it can cause bleeding. Activity Rest for the remainder of the day. Avoid sitting for a long time without moving. Get up to take short walks every 1-2 hours. This is important to improve blood flow and breathing. Ask for help if you feel weak or unsteady. Do not have sex, douche, or put a tampon or anything else in your vagina for 6 weeks or as long as told by your health care provider. Do not lift anything that is heavier than your baby for 2 weeks, or the limit that you are told, until your health care provider says that it is safe. Return to your normal activities as told by your health care provider. Ask your health care provider what activities are safe for you. Incision  care   Follow instructions from your health care provider about how to take care of your incision. Make sure you: Wash your hands with soap and water for at least 20 seconds before and after you change your bandage (dressing). If soap and water are not available, use hand sanitizer. Change your dressing as told by your health care provider. Leave stitches (sutures), skin glue, or adhesive strips in place. These skin closures may need to stay in place for 2 weeks or longer. If adhesive strip edges start to loosen and curl up, you may trim the loose edges. Do not remove adhesive strips completely unless your health care provider tells you to do that. Check your incision area every day for signs of infection. Check for: Redness, swelling, or more pain. Fluid or blood. Warmth. Pus or a bad smell. Other Instructions Do not take baths, swim, or use a hot tub until your health care provider approves. Ask your health care provider if you may take showers. You may only be allowed to take sponge baths. Keep all follow-up visits. This is important. Contact a health care provider if: You have signs of infection, such as: Redness, swelling, or more pain around your incision. Fluid or blood coming from your incision. Your incision feels warm to the touch. You have pus or a bad smell coming from your incision. Your pain does not improve after 2-3 days. The edges of your incision break open after the sutures have been removed. You have a rash. You repeatedly become dizzy or lightheaded. You have pain and pain medicine  is not helping. You are constipated. Get help right away if: You have a fever or chills. You faint. You have pain in your abdomen that gets worse, and the pain is not relieved by pain medicine. You have shortness of breath or trouble breathing. You have chest pain, leg pain, or leg swelling. You have ongoing nausea or diarrhea. These symptoms may represent a serious problem that is an  emergency. Do not wait to see if the symptoms will go away. Get medical help right away. Call your local emergency services (911 in the [place] Do not drive yourself to the hospital. Summary Mild abdominal discomfort is common after this procedure. Contact your health care provider if you experience problems or have concerns. Do not lift anything that is heavier than your baby for 2 weeks, or the limit that you are told, until your health care provider says that it is safe. Keep all follow-up visits. This is important. This information is not intended to replace advice given to you by your health care provider. Make sure you discuss any questions you have with your health care provider. Document Revised: 10/14/2019 Document Reviewed: 10/14/2019 Elsevier Patient Education  2022 [employer].

## 2020-12-31 NOTE — Progress Notes (Signed)
PRE-OPERATIVE HISTORY AND PHYSICAL EXAM  HPI:  Ashley Moyer is a 30 y.o. [phone] No LMP recorded.; she is being admitted for surgery related to requested sterilization.  PMHx: Past Medical History:  Diagnosis Date   Arthritis    KNEE   Asthma    WELL CONTROLLED   Borderline personality disorder ([place])    Cancer ([place])    Depression    Headache(784.0)    PTSD (post-traumatic stress disorder)    Past Surgical History:  Procedure Laterality Date   CHOLECYSTECTOMY     LAPAROSCOPIC OVARIAN CYSTECTOMY N/A 08/31/2018   Procedure: LAPAROSCOPIC OVARIAN CYSTECTOMY;  Surgeon: Gae Dry, MD;  Location: [facility] ORS;  Service: Gynecology;  Laterality: N/A;   WISDOM TOOTH EXTRACTION     Family History  Problem Relation Age of Onset   Cancer Mother    Cancer Maternal Aunt    Cancer Maternal Grandmother    Social History   Tobacco Use   Smoking status: Former    Packs/day: 0.25    Years: 9.00    Pack years: 2.25    Types: Cigarettes    Quit date: 05/23/2020    Years since quitting: 0.6   Smokeless tobacco: Never  Vaping Use   Vaping Use: Never used  Substance Use Topics   Alcohol use: Not Currently    Comment: rarely   Drug use: No    Current Outpatient Medications:    nystatin cream (MYCOSTATIN), Apply 1 application topically 2 (two) times daily. Apply to umbilicus., Disp: 30 g, Rfl: 1   omeprazole (PRILOSEC) 10 MG capsule, Take 10 mg by mouth daily., Disp: , Rfl:    Prenatal MV & Min w/FA-DHA (PRENATAL GUMMIES PO), Take by mouth., Disp: , Rfl:    PROAIR HFA 108 (90 Base) MCG/ACT inhaler, Inhale into the lungs., Disp: , Rfl:    oxyCODONE (OXY IR/ROXICODONE) 5 MG immediate release tablet, Take 1 tablet (5 mg total) by mouth every 4 (four) hours as needed (pain scale 4-7). (Patient not taking: Reported on 12/31/2020), Disp: 15 tablet, Rfl: 0  Current Facility-Administered Medications:    clotrimazole (LOTRIMIN) 1 % cream, , Topical, BID, Veal, Katelyn, CNM Allergies:  Ibuprofen and Tape  Review of Systems  Constitutional:  Negative for chills, fever and malaise/fatigue.  HENT:  Negative for congestion, sinus pain and sore throat.   Eyes:  Negative for blurred vision and pain.  Respiratory:  Negative for cough and wheezing.   Cardiovascular:  Negative for chest pain and leg swelling.  Gastrointestinal:  Negative for abdominal pain, constipation, diarrhea, heartburn, nausea and vomiting.  Genitourinary:  Negative for dysuria, frequency, hematuria and urgency.  Musculoskeletal:  Negative for back pain, joint pain, myalgias and neck pain.  Skin:  Negative for itching and rash.  Neurological:  Negative for dizziness, tremors and weakness.  Endo/Heme/Allergies:  Does not bruise/bleed easily.  Psychiatric/Behavioral:  Negative for depression. The patient is not nervous/anxious and does not have insomnia.    Objective: BP 120/80   Ht 5' 7.5" (1.715 m)   Wt 273 lb (123.8 kg)   Breastfeeding Yes   BMI 42.13 kg/m   Filed Weights   12/31/20 1006  Weight: 273 lb (123.8 kg)   Physical Exam Constitutional:      General: She is not in acute distress.    Appearance: She is well-developed.  HENT:     Head: Normocephalic and atraumatic. No laceration.     Right Ear: Hearing normal.  Left Ear: Hearing normal.     Mouth/Throat:     Pharynx: Uvula midline.  Eyes:     Pupils: Pupils are equal, round, and reactive to light.  Neck:     Thyroid: No thyromegaly.  Cardiovascular:     Rate and Rhythm: Normal rate and regular rhythm.     Heart sounds: No murmur heard.   No friction rub. No gallop.  Pulmonary:     Effort: Pulmonary effort is normal. No respiratory distress.     Breath sounds: Normal breath sounds. No wheezing.  Abdominal:     General: Bowel sounds are normal. There is no distension.     Palpations: Abdomen is soft.     Tenderness: There is no abdominal tenderness. There is no rebound.  Musculoskeletal:        General: Normal range of  motion.     Cervical back: Normal range of motion and neck supple.  Neurological:     Mental Status: She is alert and oriented to person, place, and time.     Cranial Nerves: No cranial nerve deficit.  Skin:    General: Skin is warm and dry.  Psychiatric:        Judgment: Judgment normal.  Vitals reviewed.    Assessment: 1. Sterilization consult   The patient has been fully informed about all methods of contraception, both temporary and permanent. She understands that tubal ligation is meant to be permanent, absolute and irreversible. She was told that there is an approximately 1 in 400 chance of a pregnancy in the future after tubal ligation. She was told the short and long term complications of tubal ligation. She understands the risks from this surgery include, but are not limited to, the risks of anesthesia, hemorrhage, infection, perforation, and injury to adjacent structures, bowel, bladder and blood vessels.   Barnett Applebaum, MD, [name], [facility] 12/31/2020  10:35 AM

## 2021-01-01 ENCOUNTER — Telehealth: Payer: Self-pay

## 2021-01-01 NOTE — Telephone Encounter (Signed)
Left a message for the patient to return the call.  

## 2021-01-01 NOTE — Telephone Encounter (Signed)
Patient called back to schedule Laparoscopy with tubal partial salpingectomy w [facility]  DOS 01/31/21  H&P n/a  Pre-admit phone call appointment 01/18/21 @ 8a - 1p  Advised that pt may also receive calls from the hospital pharmacy and pre-service center.  Confirmed pt has Medicaid as primary insurance. No secondary insurance.   Medicaid consent signed 12/31/20

## 2021-01-01 NOTE — Telephone Encounter (Signed)
-----   Message from Gae Dry, MD sent at 12/31/2020 10:26 AM EST ----- Regarding: Scurgery Dec 22 Preop today  Surgery Booking Request Patient Full Name:  Ashley Moyer  MRN: [id number]  DOB: 1990-11-25  Surgeon: Hoyt Koch, MD  Requested Surgery Date and Time: 01/31/21 Primary Diagnosis AND Code: Sterilization Secondary Diagnosis and Code: Z30.09 Surgical Procedure: Laparoscopy with Tubal Partial Salingectomy RNFA Requested?: No L&D Notification: No Admission Status: same day surgery Length of Surgery: 30 min Special Case Needs: No H&P: No Phone Interview???:  Yes Interpreter: No Medical Clearance:  No Special Scheduling Instructions: No Any known health/anesthesia issues, diabetes, sleep apnea, latex allergy, defibrillator/pacemaker?: No Acuity: P3   (P1 highest, P2 delay may cause harm, P3 low, elective gyn, P4 lowest) Post op follow up visits: Scheduled

## 2021-01-18 ENCOUNTER — Other Ambulatory Visit: Payer: Self-pay

## 2021-01-18 ENCOUNTER — Encounter
Admission: RE | Admit: 2021-01-18 | Discharge: 2021-01-18 | Disposition: A | Discharge status: Home / Self Care | Payer: Medicaid Other | Source: Ambulatory Visit | Attending: Obstetrics & Gynecology | Admitting: Obstetrics & Gynecology

## 2021-01-18 HISTORY — DX: Gastro-esophageal reflux disease without esophagitis: K21.9

## 2021-01-18 NOTE — Patient Instructions (Signed)
Your procedure is scheduled on:01-31-21 Thursday Report to the Registration Desk on the 1st floor of the [facility].Then proceed to the 2nd floor Surgery Desk in the [facility] To find out your arrival time, please call [phone] between 1PM - 3PM on:01-30-21 Wednesday  REMEMBER: Instructions that are not followed completely may result in serious medical risk, up to and including death; or upon the discretion of your surgeon and anesthesiologist your surgery may need to be rescheduled.  Do not eat food after midnight the night before surgery.  No gum chewing, lozengers or hard candies.  You may however, drink CLEAR liquids up to 2 hours before you are scheduled to arrive for your surgery. Do not drink anything within 2 hours of your scheduled arrival time.  Clear liquids include: - water  - apple juice without pulp - gatorade (not RED, PURPLE, OR BLUE) - black coffee or tea (Do NOT add milk or creamers to the coffee or tea) Do NOT drink anything that is not on this list.  TAKE THESE MEDICATIONS THE MORNING OF SURGERY WITH A SIP OF WATER: -omeprazole (PRILOSEC) 10 MG capsule-take one the night before and one on the morning of surgery - helps to prevent nausea after surgery.)  One week prior to surgery: Stop Anti-inflammatories (NSAIDS) such as Advil, Aleve, Ibuprofen, Motrin, Naproxen, Naprosyn and Aspirin based products such as Excedrin Migraine, Goodys Powder, BC Powder.You may however, take Tylenol if needed for pain up until the day of surgery.  Stop ANY OVER THE COUNTER supplements/vitamins 7 days prior to surgery (Prenatal MV & Min w/FA-DHA (PRENATAL GUMMIES PO)  No Alcohol for 24 hours before or after surgery.  No Smoking including e-cigarettes for 24 hours prior to surgery.  No chewable tobacco products for at least 6 hours prior to surgery.  No nicotine patches on the day of surgery.  Do not use any "recreational" drugs for at least a week prior to your surgery.   Please be advised that the combination of cocaine and anesthesia may have negative outcomes, up to and including death. If you test positive for cocaine, your surgery will be cancelled.  On the morning of surgery brush your teeth with toothpaste and water, you may rinse your mouth with mouthwash if you wish. Do not swallow any toothpaste or mouthwash.  Use CHG Soap as directed on instruction sheet.  Do not wear jewelry, make-up, hairpins, clips or nail polish.  Do not wear lotions, powders, or perfumes.   Do not shave body from the neck down 48 hours prior to surgery just in case you cut yourself which could leave a site for infection.  Also, freshly shaved skin may become irritated if using the CHG soap.  Contact lenses, hearing aids and dentures may not be worn into surgery.  Do not bring valuables to the hospital. [facility] is not responsible for any missing/lost belongings or valuables.   Notify your doctor if there is any change in your medical condition (cold, fever, infection).  Wear comfortable clothing (specific to your surgery type) to the hospital.  After surgery, you can help prevent lung complications by doing breathing exercises.  Take deep breaths and cough every 1-2 hours. Your doctor may order a device called an Incentive Spirometer to help you take deep breaths. When coughing or sneezing, hold a pillow firmly against your incision with both hands. This is called "splinting." Doing this helps protect your incision. It also decreases belly discomfort.  If you are being admitted  to the hospital overnight, leave your suitcase in the car. After surgery it may be brought to your room.  If you are being discharged the day of surgery, you will not be allowed to drive home. You will need a responsible adult (18 years or older) to drive you home and stay with you that night.   If you are taking public transportation, you will need to have a responsible adult (18 years or  older) with you. Please confirm with your physician that it is acceptable to use public transportation.   Please call the [facility] Dept. at [phone] if you have any questions about these instructions.  Surgery Visitation Policy:  Patients undergoing a surgery or procedure may have one family member or support person with them as long as that person is not COVID-19 positive or experiencing its symptoms.  That person may remain in the waiting area during the procedure and may rotate out with other people.  Inpatient Visitation:    Visiting hours are 7 a.m. to 8 p.m. Up to two visitors ages 16+ are allowed at one time in a patient room. The visitors may rotate out with other people during the day. Visitors must check out when they leave, or other visitors will not be allowed. One designated support person may remain overnight. The visitor must pass COVID-19 screenings, use hand sanitizer when entering and exiting the patient's room and wear a mask at all times, including in the patient's room. Patients must also wear a mask when staff or their visitor are in the room. Masking is required regardless of vaccination status.

## 2021-01-24 ENCOUNTER — Encounter
Admission: RE | Admit: 2021-01-24 | Discharge: 2021-01-24 | Disposition: A | Discharge status: Home / Self Care | Payer: Medicaid Other | Source: Ambulatory Visit | Attending: Obstetrics & Gynecology | Admitting: Obstetrics & Gynecology

## 2021-01-24 ENCOUNTER — Other Ambulatory Visit: Payer: Self-pay

## 2021-01-24 DIAGNOSIS — Z01812 Encounter for preprocedural laboratory examination: Secondary | ICD-10-CM | POA: Insufficient documentation

## 2021-01-24 DIAGNOSIS — Z3009 Encounter for other general counseling and advice on contraception: Secondary | ICD-10-CM

## 2021-01-24 LAB — CBC
HCT: 39.9 % (ref 36.0–46.0)
Hemoglobin: 12.8 g/dL (ref 12.0–15.0)
MCH: 29.9 pg (ref 26.0–34.0)
MCHC: 32.1 g/dL (ref 30.0–36.0)
MCV: 93.2 fL (ref 80.0–100.0)
Platelets: 264 10*3/uL (ref 150–400)
RBC: 4.28 MIL/uL (ref 3.87–5.11)
RDW: 14.1 % (ref 11.5–15.5)
WBC: 7.7 10*3/uL (ref 4.0–10.5)
nRBC: 0 % (ref 0.0–0.2)

## 2021-01-24 LAB — TYPE AND SCREEN
ABO/RH(D): A POS
Antibody Screen: NEGATIVE
Extend sample reason: UNDETERMINED

## 2021-01-31 ENCOUNTER — Ambulatory Visit: Payer: Medicaid Other | Admitting: Urgent Care

## 2021-01-31 ENCOUNTER — Other Ambulatory Visit: Payer: Self-pay

## 2021-01-31 ENCOUNTER — Ambulatory Visit: Payer: Medicaid Other | Admitting: Registered Nurse

## 2021-01-31 ENCOUNTER — Encounter: Payer: Self-pay | Admitting: Obstetrics & Gynecology

## 2021-01-31 ENCOUNTER — Ambulatory Visit
Admission: RE | Admit: 2021-01-31 | Discharge: 2021-01-31 | Disposition: A | Discharge status: Home / Self Care | Payer: Medicaid Other | Attending: Obstetrics & Gynecology | Admitting: Obstetrics & Gynecology

## 2021-01-31 ENCOUNTER — Encounter
Admission: RE | Disposition: A | Discharge status: Home / Self Care | Payer: Self-pay | Source: Home / Self Care | Attending: Obstetrics & Gynecology

## 2021-01-31 DIAGNOSIS — Z6838 Body mass index (BMI) 38.0-38.9, adult: Secondary | ICD-10-CM | POA: Insufficient documentation

## 2021-01-31 DIAGNOSIS — Z302 Encounter for sterilization: Secondary | ICD-10-CM | POA: Insufficient documentation

## 2021-01-31 DIAGNOSIS — Z87891 Personal history of nicotine dependence: Secondary | ICD-10-CM | POA: Diagnosis not present

## 2021-01-31 DIAGNOSIS — K219 Gastro-esophageal reflux disease without esophagitis: Secondary | ICD-10-CM | POA: Insufficient documentation

## 2021-01-31 DIAGNOSIS — J45909 Unspecified asthma, uncomplicated: Secondary | ICD-10-CM | POA: Diagnosis not present

## 2021-01-31 DIAGNOSIS — Z3009 Encounter for other general counseling and advice on contraception: Secondary | ICD-10-CM

## 2021-01-31 HISTORY — PX: LAPAROSCOPIC BILATERAL SALPINGECTOMY: SHX5889

## 2021-01-31 LAB — POCT PREGNANCY, URINE: Preg Test, Ur: NEGATIVE

## 2021-01-31 LAB — TYPE AND SCREEN
ABO/RH(D): A POS
Antibody Screen: NEGATIVE

## 2021-01-31 SURGERY — SALPINGECTOMY, BILATERAL, LAPAROSCOPIC
Anesthesia: General | Laterality: Bilateral

## 2021-01-31 MED ORDER — ACETAMINOPHEN 650 MG RE SUPP
650.0000 mg | RECTAL | Status: DC | PRN
  Filled 2021-01-31: qty 1

## 2021-01-31 MED ORDER — ONDANSETRON HCL 4 MG/2ML IJ SOLN
INTRAMUSCULAR | Status: AC
  Filled 2021-01-31: qty 4

## 2021-01-31 MED ORDER — SUGAMMADEX SODIUM 200 MG/2ML IV SOLN
INTRAVENOUS | Status: DC | PRN
  Administered 2021-01-31: 200 mg via INTRAVENOUS

## 2021-01-31 MED ORDER — LIDOCAINE HCL (CARDIAC) PF 100 MG/5ML IV SOSY
PREFILLED_SYRINGE | INTRAVENOUS | Status: DC | PRN
  Administered 2021-01-31: 100 mg via INTRAVENOUS

## 2021-01-31 MED ORDER — POVIDONE-IODINE 10 % EX SWAB: 2.0000 "application " | Freq: Once | CUTANEOUS | Status: DC

## 2021-01-31 MED ORDER — LACTATED RINGERS IV SOLN: INTRAVENOUS | Status: DC

## 2021-01-31 MED ORDER — BUPIVACAINE HCL (PF) 0.5 % IJ SOLN
INTRAMUSCULAR | Status: AC
  Filled 2021-01-31: qty 30

## 2021-01-31 MED ORDER — FENTANYL CITRATE (PF) 100 MCG/2ML IJ SOLN
INTRAMUSCULAR | Status: DC | PRN
  Administered 2021-01-31: 50 ug via INTRAVENOUS
  Administered 2021-01-31: 25 ug via INTRAVENOUS
  Administered 2021-01-31: 50 ug via INTRAVENOUS
  Administered 2021-01-31: 25 ug via INTRAVENOUS
  Administered 2021-01-31: 50 ug via INTRAVENOUS

## 2021-01-31 MED ORDER — CHLORHEXIDINE GLUCONATE 0.12 % MT SOLN
OROMUCOSAL | Status: AC
  Filled 2021-01-31: qty 15

## 2021-01-31 MED ORDER — MEPERIDINE HCL 25 MG/ML IJ SOLN: 6.2500 mg | INTRAMUSCULAR | Status: DC | PRN

## 2021-01-31 MED ORDER — MIDAZOLAM HCL 2 MG/2ML IJ SOLN
INTRAMUSCULAR | Status: AC
  Filled 2021-01-31: qty 2

## 2021-01-31 MED ORDER — ROCURONIUM BROMIDE 10 MG/ML (PF) SYRINGE
PREFILLED_SYRINGE | INTRAVENOUS | Status: AC
  Filled 2021-01-31: qty 10

## 2021-01-31 MED ORDER — ONDANSETRON HCL 4 MG/2ML IJ SOLN
INTRAMUSCULAR | Status: DC | PRN
  Administered 2021-01-31: 4 mg via INTRAVENOUS

## 2021-01-31 MED ORDER — PROPOFOL 10 MG/ML IV BOLUS
INTRAVENOUS | Status: AC
  Filled 2021-01-31: qty 20

## 2021-01-31 MED ORDER — SUCCINYLCHOLINE CHLORIDE 200 MG/10ML IV SOSY
PREFILLED_SYRINGE | INTRAVENOUS | Status: DC | PRN
  Administered 2021-01-31: 120 mg via INTRAVENOUS

## 2021-01-31 MED ORDER — KETOROLAC TROMETHAMINE 30 MG/ML IJ SOLN
INTRAMUSCULAR | Status: AC
  Filled 2021-01-31: qty 1

## 2021-01-31 MED ORDER — ONDANSETRON HCL 4 MG/2ML IJ SOLN: 4.0000 mg | Freq: Once | INTRAMUSCULAR | Status: DC | PRN

## 2021-01-31 MED ORDER — FENTANYL CITRATE (PF) 100 MCG/2ML IJ SOLN
INTRAMUSCULAR | Status: AC
  Filled 2021-01-31: qty 2

## 2021-01-31 MED ORDER — MORPHINE SULFATE (PF) 2 MG/ML IV SOLN: 1.0000 mg | INTRAVENOUS | Status: DC | PRN

## 2021-01-31 MED ORDER — ORAL CARE MOUTH RINSE: 15.0000 mL | Freq: Once | OROMUCOSAL | Status: AC

## 2021-01-31 MED ORDER — SUCCINYLCHOLINE CHLORIDE 200 MG/10ML IV SOSY
PREFILLED_SYRINGE | INTRAVENOUS | Status: AC
  Filled 2021-01-31: qty 10

## 2021-01-31 MED ORDER — OXYCODONE-ACETAMINOPHEN 5-325 MG PO TABS
1.0000 | ORAL_TABLET | ORAL | 0 refills | Status: DC | PRN
Start: 2021-01-31 — End: 2021-02-08

## 2021-01-31 MED ORDER — CHLORHEXIDINE GLUCONATE 0.12 % MT SOLN
15.0000 mL | Freq: Once | OROMUCOSAL | Status: AC
  Administered 2021-01-31: 08:00:00 15 mL via OROMUCOSAL

## 2021-01-31 MED ORDER — ACETAMINOPHEN 325 MG PO TABS: 650.0000 mg | ORAL_TABLET | ORAL | Status: DC | PRN

## 2021-01-31 MED ORDER — PROPOFOL 10 MG/ML IV BOLUS
INTRAVENOUS | Status: DC | PRN
  Administered 2021-01-31: 180 mg via INTRAVENOUS

## 2021-01-31 MED ORDER — BUPIVACAINE HCL (PF) 0.5 % IJ SOLN
INTRAMUSCULAR | Status: DC | PRN
  Administered 2021-01-31: 12 mL

## 2021-01-31 MED ORDER — OXYCODONE-ACETAMINOPHEN 5-325 MG PO TABS
1.0000 | ORAL_TABLET | ORAL | Status: DC | PRN
  Administered 2021-01-31: 11:00:00 1 via ORAL

## 2021-01-31 MED ORDER — ROCURONIUM BROMIDE 100 MG/10ML IV SOLN
INTRAVENOUS | Status: DC | PRN
  Administered 2021-01-31: 10 mg via INTRAVENOUS
  Administered 2021-01-31: 20 mg via INTRAVENOUS

## 2021-01-31 MED ORDER — LIDOCAINE HCL (PF) 2 % IJ SOLN
INTRAMUSCULAR | Status: AC
  Filled 2021-01-31: qty 5

## 2021-01-31 MED ORDER — DEXAMETHASONE SODIUM PHOSPHATE 10 MG/ML IJ SOLN
INTRAMUSCULAR | Status: DC | PRN
  Administered 2021-01-31: 5 mg via INTRAVENOUS

## 2021-01-31 MED ORDER — MIDAZOLAM HCL 2 MG/2ML IJ SOLN
INTRAMUSCULAR | Status: DC | PRN
  Administered 2021-01-31: 2 mg via INTRAVENOUS

## 2021-01-31 MED ORDER — LIDOCAINE HCL (PF) 2 % IJ SOLN
INTRAMUSCULAR | Status: AC
  Filled 2021-01-31: qty 10

## 2021-01-31 MED ORDER — EPHEDRINE 5 MG/ML INJ
INTRAVENOUS | Status: AC
  Filled 2021-01-31: qty 5

## 2021-01-31 MED ORDER — EPHEDRINE SULFATE 50 MG/ML IJ SOLN
INTRAMUSCULAR | Status: DC | PRN
  Administered 2021-01-31 (×3): 5 mg via INTRAVENOUS

## 2021-01-31 MED ORDER — DEXAMETHASONE SODIUM PHOSPHATE 10 MG/ML IJ SOLN
INTRAMUSCULAR | Status: AC
  Filled 2021-01-31: qty 2

## 2021-01-31 MED ORDER — OXYCODONE-ACETAMINOPHEN 5-325 MG PO TABS
ORAL_TABLET | ORAL | Status: AC
  Filled 2021-01-31: qty 1

## 2021-01-31 MED ORDER — DEXMEDETOMIDINE HCL IN NACL 200 MCG/50ML IV SOLN
INTRAVENOUS | Status: DC | PRN
  Administered 2021-01-31: 8 ug via INTRAVENOUS

## 2021-01-31 MED ORDER — FENTANYL CITRATE (PF) 100 MCG/2ML IJ SOLN
25.0000 ug | INTRAMUSCULAR | Status: DC | PRN
  Administered 2021-01-31: 11:00:00 25 ug via INTRAVENOUS

## 2021-01-31 SURGICAL SUPPLY — 45 items
ADH SKN CLS APL DERMABOND .7 (GAUZE/BANDAGES/DRESSINGS) ×1
APL PRP STRL LF DISP 70% ISPRP (MISCELLANEOUS)
BAG SPEC RTRVL LRG 6X4 10 (ENDOMECHANICALS)
BLADE SURG SZ11 CARB STEEL (BLADE) ×2 IMPLANT
CATH ROBINSON RED A/P 16FR (CATHETERS) ×2 IMPLANT
CHLORAPREP W/TINT 26 (MISCELLANEOUS) ×1 IMPLANT
DERMABOND ADVANCED (GAUZE/BANDAGES/DRESSINGS) ×1
DERMABOND ADVANCED .7 DNX12 (GAUZE/BANDAGES/DRESSINGS) ×1 IMPLANT
DRSG TELFA 4X3 1S NADH ST (GAUZE/BANDAGES/DRESSINGS) IMPLANT
GAUZE 4X4 16PLY ~~LOC~~+RFID DBL (SPONGE) ×4 IMPLANT
GLOVE SURG ENC MOIS LTX SZ8 (GLOVE) ×4 IMPLANT
GLOVE SURG UNDER LTX SZ8 (GLOVE) ×2 IMPLANT
GOWN STRL REUS W/ TWL LRG LVL3 (GOWN DISPOSABLE) ×1 IMPLANT
GOWN STRL REUS W/ TWL XL LVL3 (GOWN DISPOSABLE) ×1 IMPLANT
GOWN STRL REUS W/TWL LRG LVL3 (GOWN DISPOSABLE) ×2
GOWN STRL REUS W/TWL XL LVL3 (GOWN DISPOSABLE) ×2
GRASPER SUT TROCAR 14GX15 (MISCELLANEOUS) ×1 IMPLANT
IRRIGATION STRYKERFLOW (MISCELLANEOUS) IMPLANT
IRRIGATOR STRYKERFLOW (MISCELLANEOUS)
IV LACTATED RINGERS 1000ML (IV SOLUTION) IMPLANT
KIT PINK PAD W/HEAD ARE REST (MISCELLANEOUS) ×2
KIT PINK PAD W/HEAD ARM REST (MISCELLANEOUS) ×1 IMPLANT
LABEL OR SOLS (LABEL) ×2 IMPLANT
MANIFOLD NEPTUNE II (INSTRUMENTS) ×2 IMPLANT
NEEDLE VERESS 14GA 120MM (NEEDLE) ×2 IMPLANT
NS IRRIG 500ML POUR BTL (IV SOLUTION) ×2 IMPLANT
PACK GYN LAPAROSCOPIC (MISCELLANEOUS) ×2 IMPLANT
PAD PREP 24X41 OB/GYN DISP (PERSONAL CARE ITEMS) ×2 IMPLANT
POUCH SPECIMEN RETRIEVAL 10MM (ENDOMECHANICALS) IMPLANT
SCISSORS METZENBAUM CVD 33 (INSTRUMENTS) ×1 IMPLANT
SCRUB EXIDINE 4% CHG 4OZ (MISCELLANEOUS) ×2 IMPLANT
SET TUBE SMOKE EVAC HIGH FLOW (TUBING) ×2 IMPLANT
SHEARS HARMONIC ACE PLUS 36CM (ENDOMECHANICALS) ×1 IMPLANT
SLEEVE ENDOPATH XCEL 5M (ENDOMECHANICALS) ×1 IMPLANT
SOL PREP PROV IODINE SCRUB 4OZ (MISCELLANEOUS) ×2 IMPLANT
SPONGE GAUZE 2X2 8PLY STRL LF (GAUZE/BANDAGES/DRESSINGS) IMPLANT
STRAP SAFETY 5IN WIDE (MISCELLANEOUS) ×2 IMPLANT
SUT VIC AB 0 CT1 36 (SUTURE) ×2 IMPLANT
SUT VIC AB 2-0 UR6 27 (SUTURE) IMPLANT
SUT VIC AB 4-0 PS2 18 (SUTURE) ×1 IMPLANT
SYR 10ML LL (SYRINGE) ×2 IMPLANT
SYSTEM WECK SHIELD CLOSURE (TROCAR) IMPLANT
TROCAR ENDO BLADELESS 11MM (ENDOMECHANICALS) IMPLANT
TROCAR XCEL NON-BLD 5MMX100MML (ENDOMECHANICALS) ×2 IMPLANT
WATER STERILE IRR 500ML POUR (IV SOLUTION) ×2 IMPLANT

## 2021-01-31 NOTE — H&P (Signed)
PRE-OPERATIVE HISTORY AND PHYSICAL EXAM   HPI:  Ashley Moyer is a 30 y.o. [phone] No LMP recorded.; she is being admitted for surgery related to requested sterilization.   PMHx:     Past Medical History:  Diagnosis Date   Arthritis      KNEE   Asthma      WELL CONTROLLED   Borderline personality disorder ([place])     Cancer ([place])     Depression     Headache(784.0)     PTSD (post-traumatic stress disorder)           Past Surgical History:  Procedure Laterality Date   CHOLECYSTECTOMY       LAPAROSCOPIC OVARIAN CYSTECTOMY N/A 08/31/2018    Procedure: LAPAROSCOPIC OVARIAN CYSTECTOMY;  Surgeon: Gae Dry, MD;  Location: [facility] ORS;  Service: Gynecology;  Laterality: N/A;   WISDOM TOOTH EXTRACTION             Family History  Problem Relation Age of Onset   Cancer Mother     Cancer Maternal Aunt     Cancer Maternal Grandmother      Social History         Tobacco Use   Smoking status: Former      Packs/day: 0.25      Years: 9.00      Pack years: 2.25      Types: Cigarettes      Quit date: 05/23/2020      Years since quitting: 0.6   Smokeless tobacco: Never  Vaping Use   Vaping Use: Never used  Substance Use Topics   Alcohol use: Not Currently      Comment: rarely   Drug use: No      Current Outpatient Medications:    nystatin cream (MYCOSTATIN), Apply 1 application topically 2 (two) times daily. Apply to umbilicus., Disp: 30 g, Rfl: 1   omeprazole (PRILOSEC) 10 MG capsule, Take 10 mg by mouth daily., Disp: , Rfl:    Prenatal MV & Min w/FA-DHA (PRENATAL GUMMIES PO), Take by mouth., Disp: , Rfl:    PROAIR HFA 108 (90 Base) MCG/ACT inhaler, Inhale into the lungs., Disp: , Rfl:    oxyCODONE (OXY IR/ROXICODONE) 5 MG immediate release tablet, Take 1 tablet (5 mg total) by mouth every 4 (four) hours as needed (pain scale 4-7). (Patient not taking: Reported on 12/31/2020), Disp: 15 tablet, Rfl: 0   Current Facility-Administered Medications:     clotrimazole (LOTRIMIN) 1 % cream, , Topical, BID, Veal, Katelyn, CNM Allergies: Ibuprofen and Tape   Review of Systems  Constitutional:  Negative for chills, fever and malaise/fatigue.  HENT:  Negative for congestion, sinus pain and sore throat.   Eyes:  Negative for blurred vision and pain.  Respiratory:  Negative for cough and wheezing.   Cardiovascular:  Negative for chest pain and leg swelling.  Gastrointestinal:  Negative for abdominal pain, constipation, diarrhea, heartburn, nausea and vomiting.  Genitourinary:  Negative for dysuria, frequency, hematuria and urgency.  Musculoskeletal:  Negative for back pain, joint pain, myalgias and neck pain.  Skin:  Negative for itching and rash.  Neurological:  Negative for dizziness, tremors and weakness.  Endo/Heme/Allergies:  Does not bruise/bleed easily.  Psychiatric/Behavioral:  Negative for depression. The patient is not nervous/anxious and does not have insomnia.     Objective: BP 120/80    Ht 5' 7.5" (1.715 m)    Wt 273 lb (123.8 kg)    Breastfeeding Yes    BMI 42.13 kg/m  Filed Weights    12/31/20 1006  Weight: 273 lb (123.8 kg)    Physical Exam Constitutional:      General: She is not in acute distress.    Appearance: She is well-developed.  HENT:     Head: Normocephalic and atraumatic. No laceration.     Right Ear: Hearing normal.     Left Ear: Hearing normal.     Mouth/Throat:     Pharynx: Uvula midline.  Eyes:     Pupils: Pupils are equal, round, and reactive to light.  Neck:     Thyroid: No thyromegaly.  Cardiovascular:     Rate and Rhythm: Normal rate and regular rhythm.     Heart sounds: No murmur heard.   No friction rub. No gallop.  Pulmonary:     Effort: Pulmonary effort is normal. No respiratory distress.     Breath sounds: Normal breath sounds. No wheezing.  Abdominal:     General: Bowel sounds are normal. There is no distension.     Palpations: Abdomen is soft.     Tenderness: There is no  abdominal tenderness. There is no rebound.  Musculoskeletal:        General: Normal range of motion.     Cervical back: Normal range of motion and neck supple.  Neurological:     Mental Status: She is alert and oriented to person, place, and time.     Cranial Nerves: No cranial nerve deficit.  Skin:    General: Skin is warm and dry.  Psychiatric:        Judgment: Judgment normal.  Vitals reviewed.      Assessment: 1. Sterilization consult   The patient has been fully informed about all methods of contraception, both temporary and permanent. She understands that tubal ligation is meant to be permanent, absolute and irreversible. She was told that there is an approximately 1 in 400 chance of a pregnancy in the future after tubal ligation. She was told the short and long term complications of tubal ligation. She understands the risks from this surgery include, but are not limited to, the risks of anesthesia, hemorrhage, infection, perforation, and injury to adjacent structures, bowel, bladder and blood vessels.   Barnett Applebaum, MD, [name], [facility] 01/31/2021  7:58 AM

## 2021-01-31 NOTE — Anesthesia Preprocedure Evaluation (Signed)
Anesthesia Evaluation  Patient identified by MRN, date of birth, ID band Patient awake    Reviewed: Allergy & Precautions, NPO status , Patient's Chart, lab work & pertinent test results  History of Anesthesia Complications Negative for: history of anesthetic complications  Airway Mallampati: III       Dental no notable dental hx.    Pulmonary neg shortness of breath, asthma , neg sleep apnea, neg COPD, neg recent URI, Current Smoker and Patient abstained from smoking., former smoker,    Pulmonary exam normal        Cardiovascular Exercise Tolerance: Good (-) hypertension(-) Past MI and (-) CHF Normal cardiovascular exam(-) dysrhythmias (-) Valvular Problems/Murmurs     Neuro/Psych  Headaches, neg Seizures PSYCHIATRIC DISORDERS Anxiety Depression    GI/Hepatic Neg liver ROS, GERD  Medicated,  Endo/Other  neg diabetesMorbid obesity  Renal/GU negative Renal ROS     Musculoskeletal  (+) Arthritis ,   Abdominal   Peds  Hematology   Anesthesia Other Findings Arthritis  KNEE  Asthma  WELL CONTROLLED  Borderline personality disorder ([place]) Depression    GERD (gastroesophageal reflux disease) Headache(784.0)    PTSD (post-traumatic stress disorder)   Reproductive/Obstetrics (+) Pregnancy                             Anesthesia Physical  Anesthesia Plan  ASA: 2  Anesthesia Plan: General   Post-op Pain Management:    Induction: Intravenous  PONV Risk Score and Plan: 2 and Propofol infusion, Ondansetron and Midazolam  Airway Management Planned: Oral ETT  Additional Equipment:   Intra-op Plan:   Post-operative Plan:   Informed Consent: I have reviewed the patients History and Physical, chart, labs and discussed the procedure including the risks, benefits and alternatives for the proposed anesthesia with the patient or authorized representative who has indicated his/her understanding  and acceptance.       Plan Discussed with: CRNA, Anesthesiologist and Surgeon  Anesthesia Plan Comments:         Anesthesia Quick Evaluation

## 2021-01-31 NOTE — Anesthesia Procedure Notes (Signed)
Procedure Name: Intubation Date/Time: 01/31/2021 10:25 AM Performed by: Hedda Slade, CRNA Pre-anesthesia Checklist: Patient identified, Patient being monitored, Timeout performed, Emergency Drugs available and Suction available Patient Re-evaluated:Patient Re-evaluated prior to induction Oxygen Delivery Method: Circle system utilized Preoxygenation: Pre-oxygenation with 100% oxygen Induction Type: IV induction Ventilation: Mask ventilation without difficulty and Oral airway inserted - appropriate to patient size Laryngoscope Size: 3 and McGraph Grade View: Grade I Tube type: Oral Tube size: 7.0 mm Number of attempts: 1 Airway Equipment and Method: Stylet Placement Confirmation: ETT inserted through vocal cords under direct vision, positive ETCO2 and breath sounds checked- equal and bilateral Secured at: 21 cm Tube secured with: Tape Dental Injury: Teeth and Oropharynx as per pre-operative assessment

## 2021-01-31 NOTE — Transfer of Care (Signed)
Immediate Anesthesia Transfer of Care Note  Patient: Ashley Moyer  Procedure(s) Performed: LAPAROSCOPIC BILATERAL SALPINGECTOMY (Bilateral)  Patient Location: PACU  Anesthesia Type:General  Level of Consciousness: awake, alert  and oriented  Airway & Oxygen Therapy: Patient Spontanous Breathing  Post-op Assessment: Report given to RN and Post -op Vital signs reviewed and stable  Post vital signs: Reviewed and stable  Last Vitals:  Vitals Value Taken Time  BP 127/58 01/31/21 1106  Temp 36.6 C 01/31/21 1106  Pulse 78 01/31/21 1108  Resp 21 01/31/21 1108  SpO2 95 % 01/31/21 1108  Vitals shown include unvalidated device data.  Last Pain:  Vitals:   01/31/21 1106  TempSrc: Temporal  PainSc: 4          Complications: No notable events documented.

## 2021-01-31 NOTE — Discharge Instructions (Addendum)
AMBULATORY SURGERY  ?DISCHARGE INSTRUCTIONS ? ? ?The drugs that you were given will stay in your system until tomorrow so for the next 24 hours you should not: ? ?Drive an automobile ?Make any legal decisions ?Drink any alcoholic beverage ? ? ?You may resume regular meals tomorrow.  Today it is better to start with liquids and gradually work up to solid foods. ? ?You may eat anything you prefer, but it is better to start with liquids, then soup and crackers, and gradually work up to solid foods. ? ? ?Please notify your doctor immediately if you have any unusual bleeding, trouble breathing, redness and pain at the surgery site, drainage, fever, or pain not relieved by medication. ? ? ? ?Additional Instructions: ? ? ? ?Please contact your physician with any problems or Same Day Surgery at [phone], Monday through Friday 6 am to 4 pm, or [place] [facility] [place] [facility] number at [phone].  ?

## 2021-01-31 NOTE — Op Note (Signed)
°  Operative Note   01/31/2021  PRE-OP DIAGNOSIS: Desire for permanent sterilization  POST-OP DIAGNOSIS: same   PROCEDURE: Procedure(s): LAPAROSCOPIC BILATERAL SALPINGECTOMY   SURGEON: Barnett Applebaum, MD, FACOG  ANESTHESIA: Choice   ESTIMATED BLOOD LOSS: Min  COMPLICATIONS: none  DISPOSITION: PACU - hemodynamically stable.  CONDITION: stable  FINDINGS: Laparoscopic survey of the abdomen revealed a grossly normal uterus, tubes, right ovaries (absent left ovary), liver edge, gallbladder edge and appendix, No intra-abdominal adhesions were noted.  PROCEDURE IN DETAIL: The patient was taken to the OR where anesthesia was administed. The patient was positioned in dorsal lithotomy in the [place]. The patient was then examined under anesthesia with the above noted findings. The patient was prepped and draped in the normal sterile fashion and bladder was drained using a red rubber cathater. Speculum exam normal, and a sponge stick was placed for manipulation purposes.  Attention was turned to the patients abdomen where a 5 mm skin incision was made in the umbilical fold, after injection of local anesthesia. The Veress step needle was carefully introduced into the peritoneal cavity with placement confirmed using the hanging drop technique.  Pneumoperitoneum was obtained. The 5 mm port was then placed under direct visualization with the operative laparoscope  The above noted findings.  Trendelenburg.  A 5 mm trocar was then placed in the right lower quadrant under direct visualization with the laparoscope.  Right and left fallopian tubes are identified and followed out to their fimbria.  Each tube is excised utilizing the Harmonic scapel to include the fibria.  No injuries or bleeding was noted.  All instruments and ports were then removed from the abdomen after gas was expelled and patient was leveled.   The skin was closed with skin adhesive. The patient tolerated the procedure well. All  counts were correct x 2. The patient was transferred to the recovery room awake, alert and breathing independently.  Barnett Applebaum, MD, [name], [facility] 01/31/2021  11:01 AM

## 2021-01-31 NOTE — Anesthesia Postprocedure Evaluation (Signed)
Anesthesia Post Note  Patient: Ashley Moyer  Procedure(s) Performed: LAPAROSCOPIC BILATERAL SALPINGECTOMY (Bilateral)  Patient location during evaluation: PACU Anesthesia Type: General Level of consciousness: awake and alert, awake and oriented Pain management: pain level controlled Vital Signs Assessment: post-procedure vital signs reviewed and stable Respiratory status: spontaneous breathing, nonlabored ventilation and respiratory function stable Cardiovascular status: blood pressure returned to baseline and stable Postop Assessment: no apparent nausea or vomiting Anesthetic complications: no   No notable events documented.   Last Vitals:  Vitals:   01/31/21 1200 01/31/21 1227  BP: (!) 101/50 107/67  Pulse: (!) 57 67  Resp: (!) 21 20  Temp: (!) 36.1 C (!) 36.1 C  SpO2: 97% 100%    Last Pain:  Vitals:   01/31/21 1227  TempSrc: Temporal  PainSc: 4                  Phill Mutter

## 2021-02-01 LAB — SURGICAL PATHOLOGY

## 2021-02-08 ENCOUNTER — Ambulatory Visit (INDEPENDENT_AMBULATORY_CARE_PROVIDER_SITE_OTHER): Payer: Medicaid Other | Admitting: Obstetrics & Gynecology

## 2021-02-08 ENCOUNTER — Encounter: Payer: Self-pay | Admitting: Obstetrics & Gynecology

## 2021-02-08 ENCOUNTER — Other Ambulatory Visit: Payer: Self-pay

## 2021-02-08 DIAGNOSIS — Z9851 Tubal ligation status: Secondary | ICD-10-CM

## 2021-02-08 MED ORDER — OXYCODONE-ACETAMINOPHEN 5-325 MG PO TABS: 1.0000 | ORAL_TABLET | ORAL | 0 refills | Status: DC | PRN

## 2021-02-08 NOTE — Progress Notes (Signed)
°  OBSTETRICS POSTPARTUM CLINIC PROGRESS NOTE  Subjective:     Ashley Moyer is a 30 y.o. [id number] female who presents for a postpartum visit. She is 6 weeks postpartum following a Term pregnancy, Single fetus, or Uncomplicated pregnancy and delivery by Vaginal, no problems at delivery.  I have fully reviewed the prenatal and intrapartum course. ALSO, she is 1 week POST OP from Lap BTL.  Mild pain at umbilicus after fall 3 days ago. Anesthesia: epidural.  Postpartum course has been complicated by uncomplicated.  Baby is feeding by Bottle.  Bleeding: patient has not  resumed menses.  Bowel function is normal. Bladder function is normal.  Patient is not sexually active. Contraception method desired is tubal ligation.  Postpartum depression screening: negative. Edinburgh 0.  The following portions of the patient's history were reviewed and updated as appropriate: allergies, current medications, past family history, past medical history, past social history, past surgical history, and problem list.  Review of Systems Pertinent items are noted in HPI.  Objective:    BP 120/80    Ht 5\' 7"  (1.702 m)    Wt 272 lb (123.4 kg)    Breastfeeding No    BMI 42.60 kg/m   General:  alert and no distress   Breasts:  inspection negative, no nipple discharge or bleeding, no masses or nodularity palpable  Lungs: clear to auscultation bilaterally  Heart:  regular rate and rhythm, S1, S2 normal, no murmur, click, rub or gallop  Abdomen: soft, non-tender; bowel sounds normal; no masses,  no organomegaly.  Well healed lap tubal incisions   Vulva:  normal  Vagina: normal vagina, no discharge, exudate, lesion, or erythema  Cervix:  no cervical motion tenderness and no lesions  Corpus: normal size, contour, position, consistency, mobility, non-tender  Adnexa:  normal adnexa and no mass, fullness, tenderness  Rectal Exam: Not performed.          Assessment:  Post Partum Care visit 1. Postpartum care  following vaginal delivery  2. S/P tubal ligation DIscussed recovery  Plan:  See orders and Patient Instructions Resume all normal activities Monitor cycles.  Option for period control include IUD.  Follow up in: a few months for PAP or as needed.   Barnett Applebaum, MD, [name], [facility] 02/08/2021  10:37 AM

## 2021-04-19 ENCOUNTER — Encounter: Payer: Self-pay | Admitting: Obstetrics & Gynecology

## 2021-04-29 ENCOUNTER — Emergency Department
Admission: EM | Admit: 2021-04-29 | Discharge: 2021-04-29 | Disposition: A | Discharge status: Home / Self Care | Payer: Medicaid Other | Attending: Emergency Medicine | Admitting: Emergency Medicine

## 2021-04-29 ENCOUNTER — Encounter: Payer: Self-pay | Admitting: Emergency Medicine

## 2021-04-29 ENCOUNTER — Other Ambulatory Visit: Payer: Self-pay

## 2021-04-29 DIAGNOSIS — M62830 Muscle spasm of back: Secondary | ICD-10-CM | POA: Insufficient documentation

## 2021-04-29 DIAGNOSIS — M545 Low back pain, unspecified: Secondary | ICD-10-CM | POA: Diagnosis present

## 2021-04-29 MED ORDER — TRAMADOL HCL 50 MG PO TABS: 50.0000 mg | ORAL_TABLET | Freq: Four times a day (QID) | ORAL | 0 refills | Status: AC | PRN

## 2021-04-29 MED ORDER — METHOCARBAMOL 500 MG PO TABS
500.0000 mg | ORAL_TABLET | Freq: Three times a day (TID) | ORAL | 0 refills | Status: AC | PRN
Start: 2021-04-29 — End: ?

## 2021-04-29 NOTE — ED Notes (Signed)
See triage note  presents with mid back pain  states pain is moving into both hips  unable to bear full wt d/t increased pain  denies any injury or urinary sxs' ?

## 2021-04-29 NOTE — ED Triage Notes (Signed)
Pt to ED via POV with c/o back pain that is shooting down both of her legs. She denies any GU issues but also states that she has not used the bathroom yet. Pt walked is with slow limping gait. Back pain starts in shoulder blades and shoots down ?

## 2021-04-29 NOTE — ED Provider Notes (Signed)
? ?[facility] ?Provider Note ? ? ? Event Date/Time  ? First MD Initiated Contact with Patient 04/29/21 [phone]   ?  (approximate) ? ? ?History  ? ?Back Pain ? ? ?HPI ? ?Ashley Moyer is a 31 y.o. female who presents with complaints of back pain.  Patient describes bilateral back pain radiating to her buttocks.  Worse with any movement.  Started last night.  No injuries reported.  No trauma.  No weakness or numbness.  No dysuria or fevers.  No IV drug abuse ?  ? ? ?Physical Exam  ? ?Triage Vital Signs: ?ED Triage Vitals  ?Enc Vitals Group  ?   BP 04/29/21 0757 (!) 110/50  ?   Pulse Rate 04/29/21 0757 80  ?   Resp 04/29/21 0757 20  ?   Temp 04/29/21 0757 97.8 ?F (36.6 ?C)  ?   Temp Source 04/29/21 0757 Oral  ?   SpO2 04/29/21 0757 93 %  ?   Weight 04/29/21 0758 121.1 kg (267 lb)  ?   Height 04/29/21 0758 1.689 m (5' 6.5")  ?   Head Circumference --   ?   Peak Flow --   ?   Pain Score 04/29/21 0758 8  ?   Pain Loc --   ?   Pain Edu? --   ?   Excl. in [place]? --   ? ? ?Most recent vital signs: ?Vitals:  ? 04/29/21 0757  ?BP: (!) 110/50  ?Pulse: 80  ?Resp: 20  ?Temp: 97.8 ?F (36.6 ?C)  ?SpO2: 93%  ? ? ? ?General: Awake, no distress.  ?CV:  Good peripheral perfusion.  ?Resp:  Normal effort.  ?Abd:  No distention.  Soft nontender ?Other:  Back: Tenderness palpation paraspinally along the lumbar vertebrae bilaterally, no vertebral tenderness to palpation.  Normal strength in the lower extremities.  Neuro intact, warm and well perfused, no abdominal pain ? ? ?ED Results / Procedures / Treatments  ? ?Labs ?(all labs ordered are listed, but only abnormal results are displayed) ?Labs Reviewed - No data to display ? ? ?EKG ? ? ? ? ?RADIOLOGY ? ? ? ? ?PROCEDURES: ? ?Critical Care performed:  ? ?Procedures ? ? ?MEDICATIONS ORDERED IN ED: ?Medications - No data to display ? ? ?IMPRESSION / MDM / ASSESSMENT AND PLAN / ED COURSE  ?I reviewed the triage vital signs and the nursing notes. ? ?Patient presents with  back pain as detailed above.  Most consistent with musculoskeletal pain.  Exam is consistent with musculoskeletal back pain.  No trauma.  No fever, no dysuria, no IV drug abuse.  Normal neuro exam. ? ?We will treat with tramadol and Robaxin, outpatient follow-up recommended, return precautions discussed ? ? ? ? ? ?  ? ? ?FINAL CLINICAL IMPRESSION(S) / ED DIAGNOSES  ? ?Final diagnoses:  ?Muscle spasm of back  ?Acute bilateral low back pain without sciatica  ? ? ? ?Rx / DC Orders  ? ?ED Discharge Orders   ? ?      Ordered  ?  traMADol (ULTRAM) 50 MG tablet  Every 6 hours PRN       ? 04/29/21 0821  ?  methocarbamol (ROBAXIN) 500 MG tablet  Every 8 hours PRN       ? 04/29/21 6160  ? ?  ?  ? ?  ? ? ? ?Note:  This document was prepared using Dragon voice recognition software and may include unintentional dictation errors. ?  ?Lavonia Drafts,  MD ?04/29/21 0825 ? ?

## 2022-06-17 ENCOUNTER — Ambulatory Visit: Payer: Medicaid Other

## 2022-08-22 IMAGING — US US PELVIS COMPLETE WITH TRANSVAGINAL
1 series · 13 of 25 positions shown · non-contrast
Comparison: Prior ultrasound from 08/16/2018.

CLINICAL DATA: Initial evaluation for right lower quadrant pain.
History of left ovarian dermoid cyst, status post oophorectomy.



[Series 1: us pelvic complete with transvaginal · 13 of 105 slices shown]
[im 1/105]
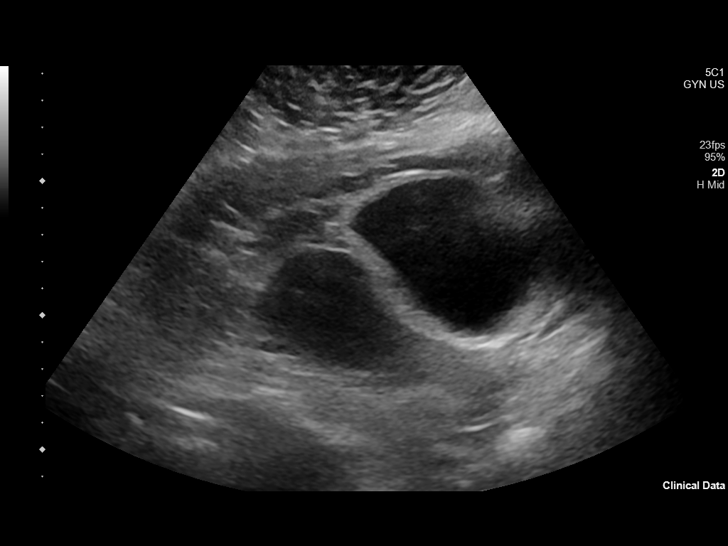
[im 9/105]
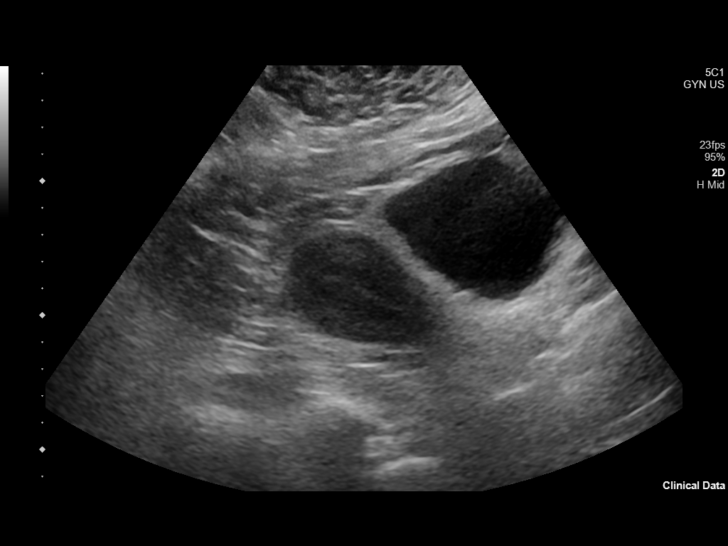
[im 18/105]
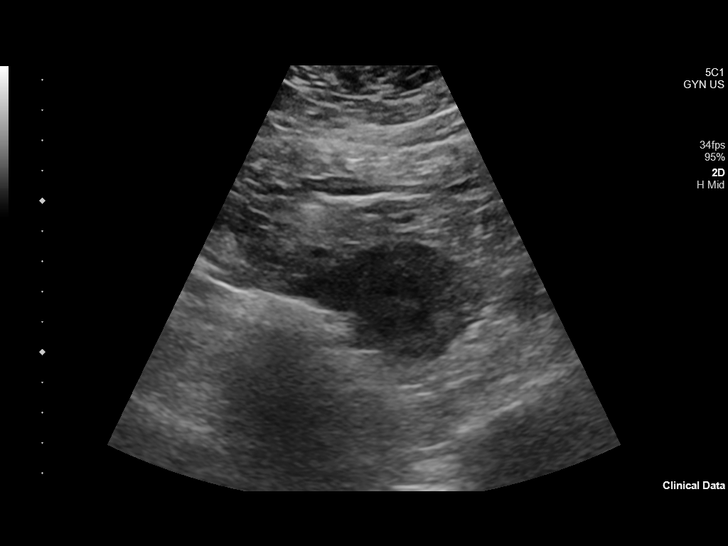
[im 27/105]
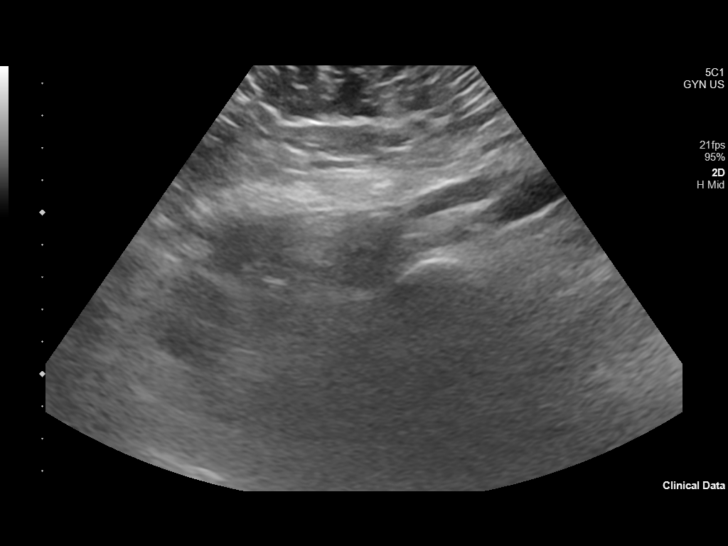
[im 35/105]
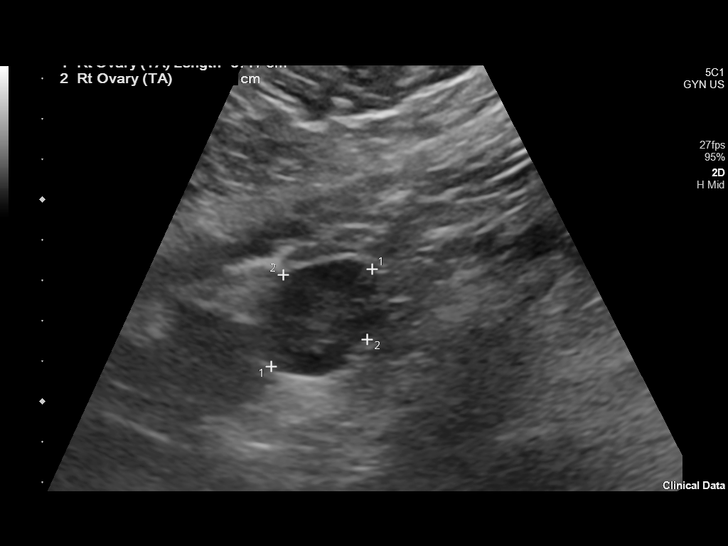
[im 44/105]
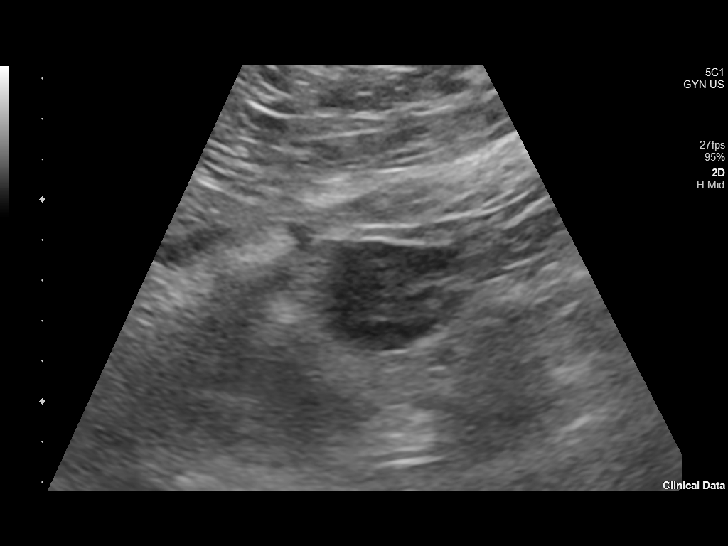
[im 53/105]
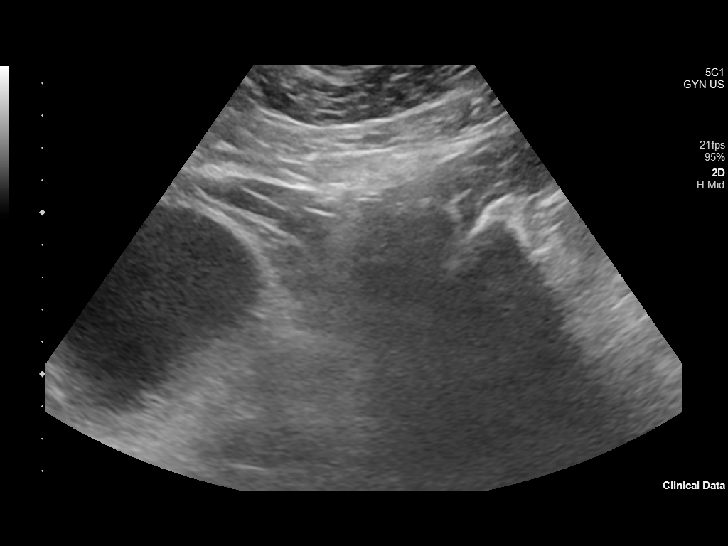
[im 61/105]
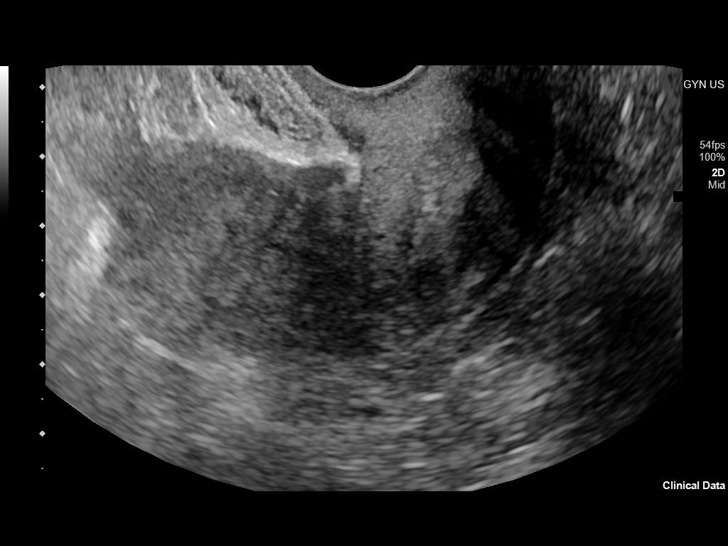
[im 70/105]
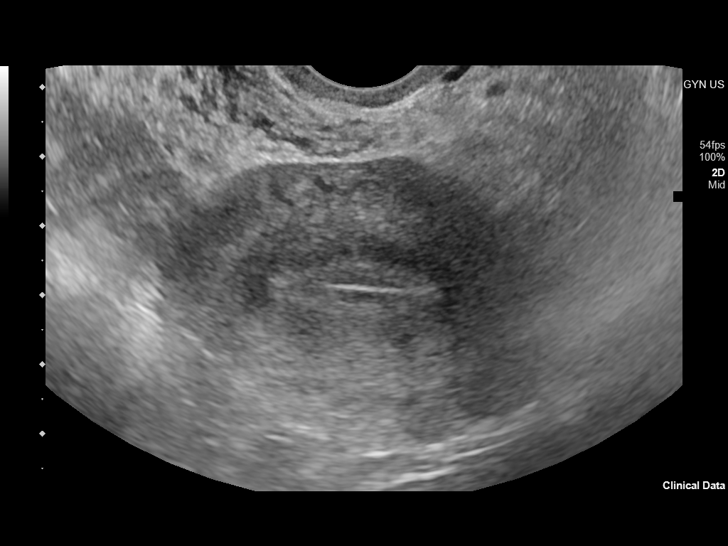
[im 79/105]
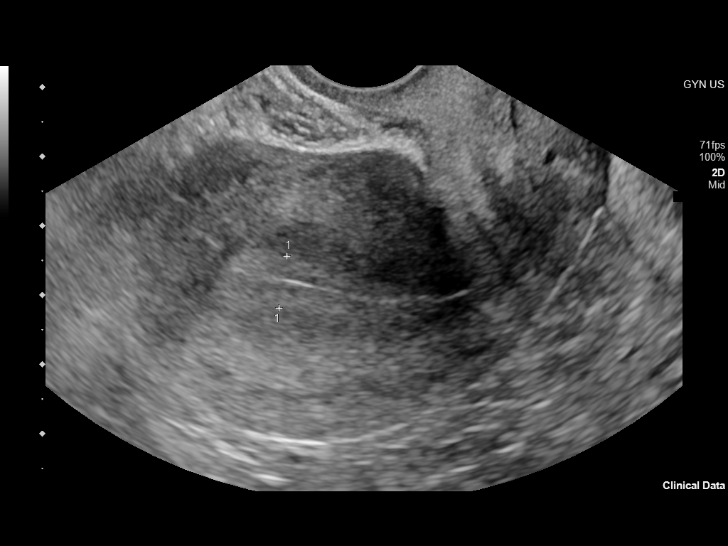
[im 87/105]
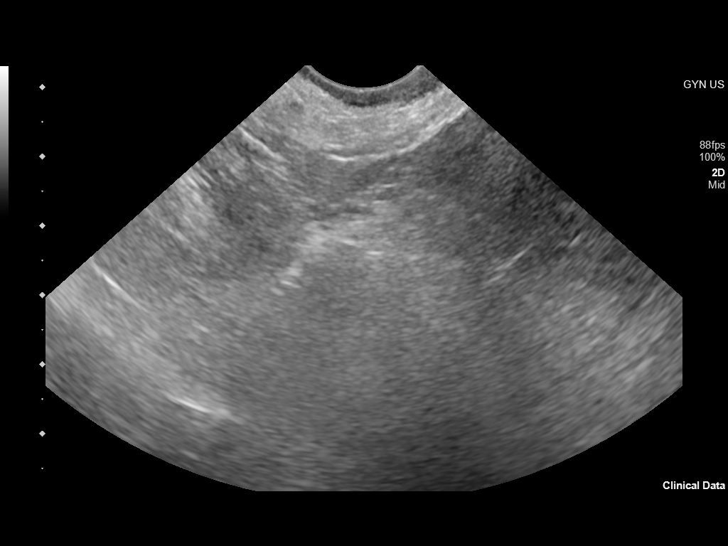
[im 96/105]
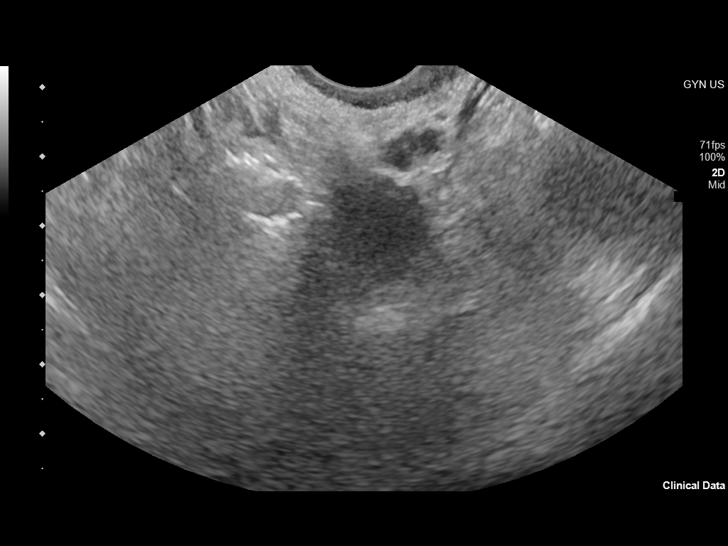
[im 105/105]
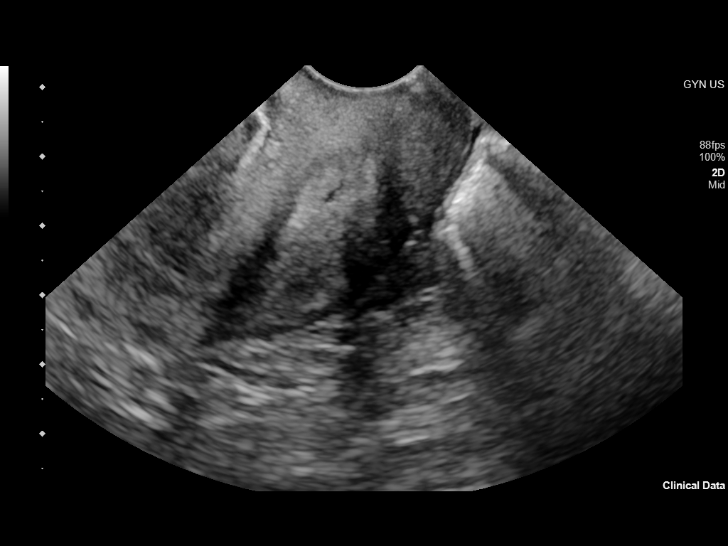

[13 of 25 positions shown; findings below may reference images not displayed]

FINDINGS: Uterus

Measurements: 10.1 x 3.9 x 6.2 cm = volume: 127 mL. No fibroids or
other mass visualized.

Endometrium

Thickness: 8 mm.  No focal abnormality visualized.

Right ovary

Measurements: 3.5 x 2.6 x 2.9 cm = volume: 13.8 mL. Normal
appearance/no adnexal mass.

Left ovary

Not visualized, reportedly surgically absent.  No adnexal mass.

Other findings

No abnormal free fluid.
IMPRESSION: 1. No acute abnormality within the pelvis. No findings to explain
patient's symptoms identified.
2. Normal sonographic appearance of the uterus, endometrium, and
right ovary.
3. Nonvisualization of the left ovary, reportedly surgically absent.
No adnexal mass or free fluid within the pelvis.

## 2022-09-26 DIAGNOSIS — Z23 Encounter for immunization: Secondary | ICD-10-CM | POA: Diagnosis not present

## 2022-12-21 IMAGING — US US OB < 14 WEEKS - US OB TV
1 series · 14 of 28 positions shown · non-contrast
Comparison: None.

CLINICAL DATA: Unknown LMP.

EXAM:
OBSTETRIC <14 WK US AND TRANSVAGINAL OB US
TECHNIQUE: Both transabdominal and transvaginal ultrasound examinations were
performed for complete evaluation of the gestation as well as the
maternal uterus, adnexal regions, and pelvic cul-de-sac.
Transvaginal technique was performed to assess early pregnancy.

[Series 1: us ob < 14 weeks - us ob tv · 0.14mm/px · 14 of 98 slices shown]
[im 4/98]
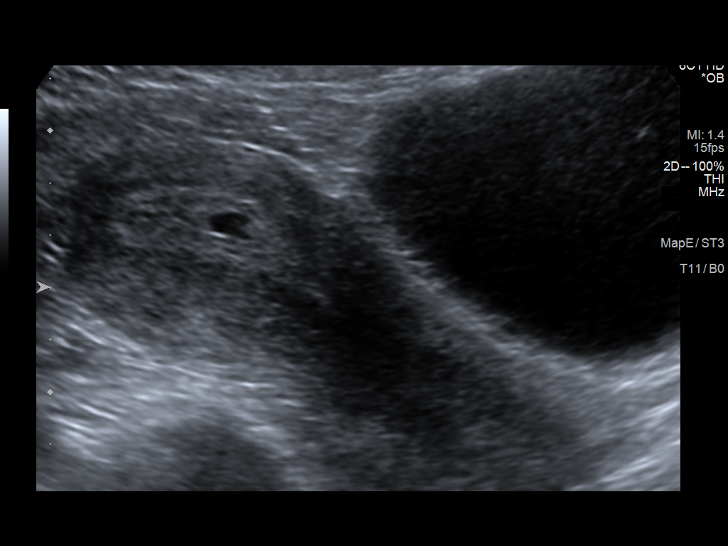
[im 11/98]
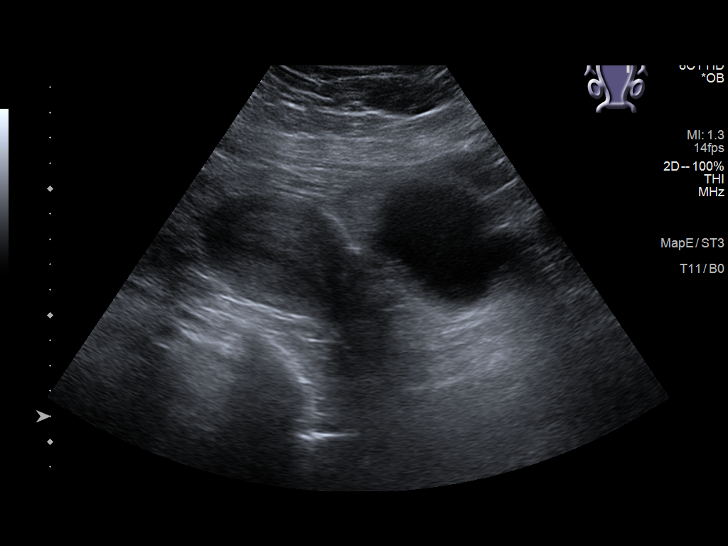
[im 18/98]
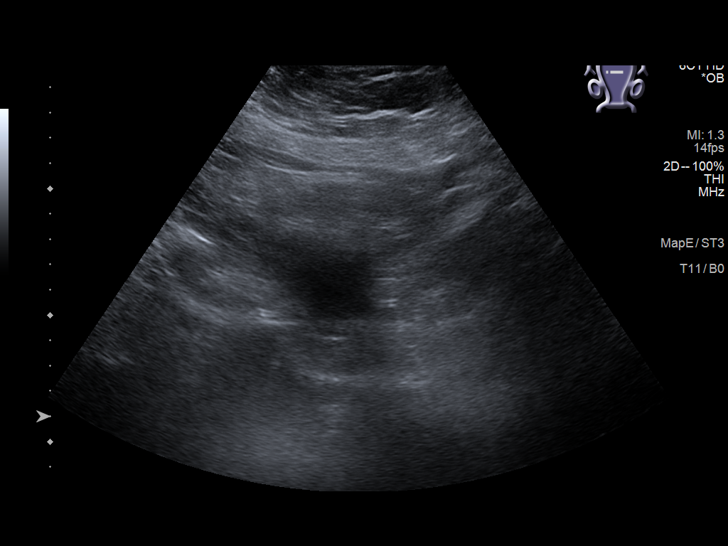
[im 26/98]
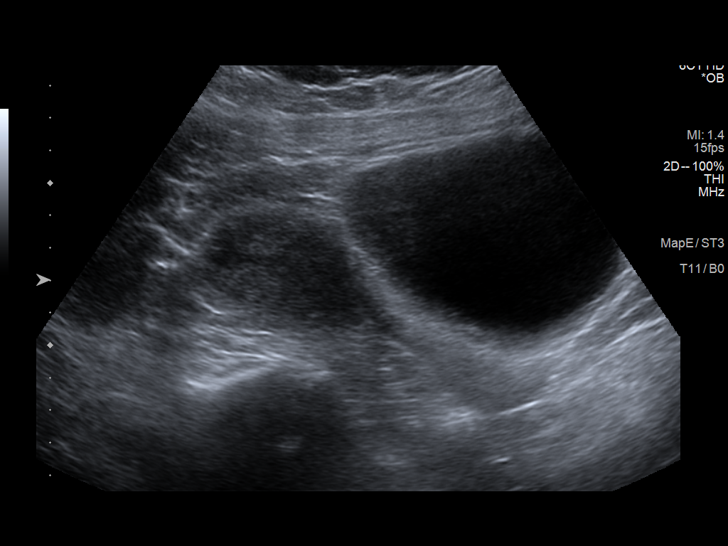
[im 33/98]
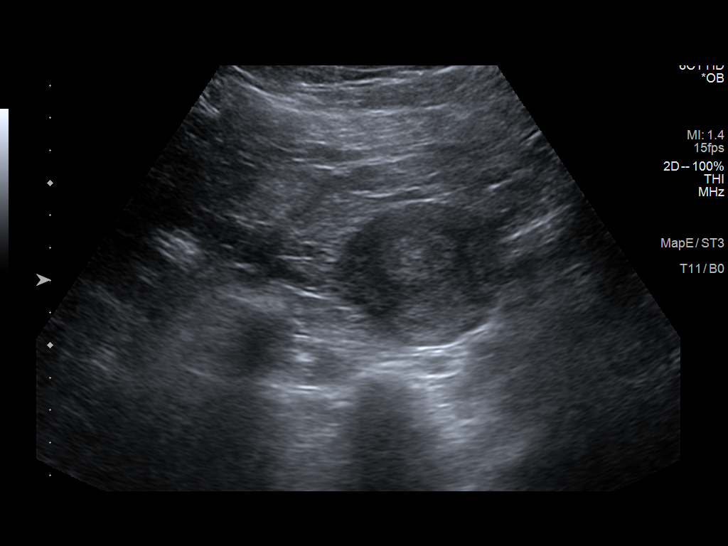
[im 40/98]
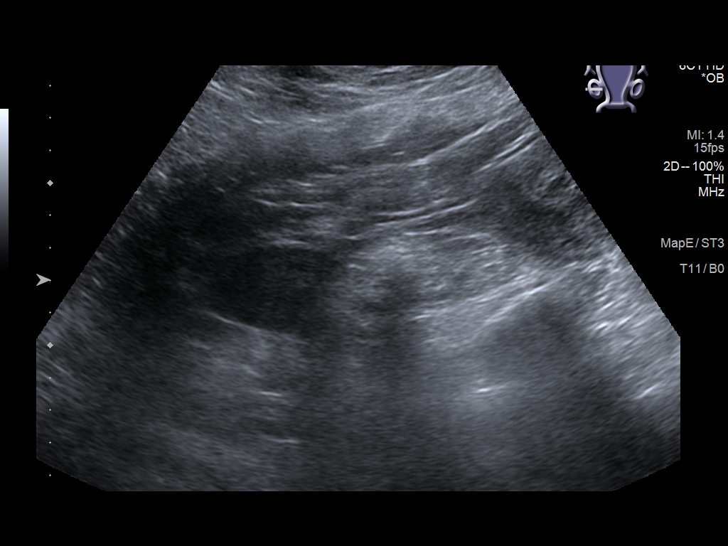
[im 47/98]
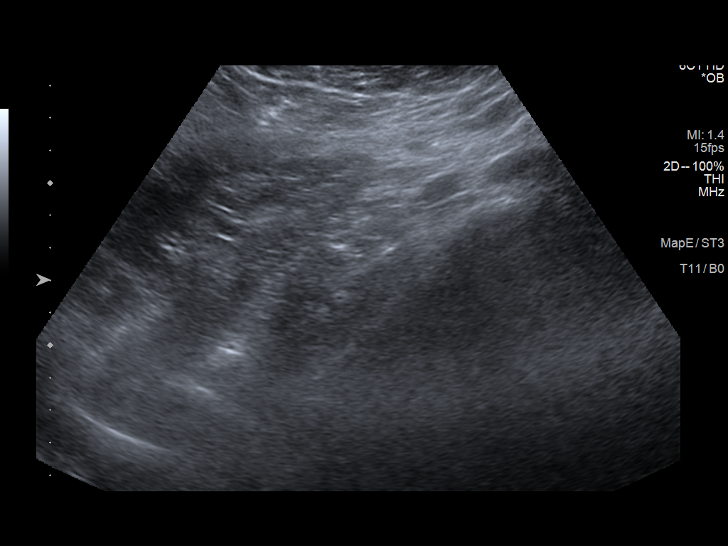
[im 54/98]
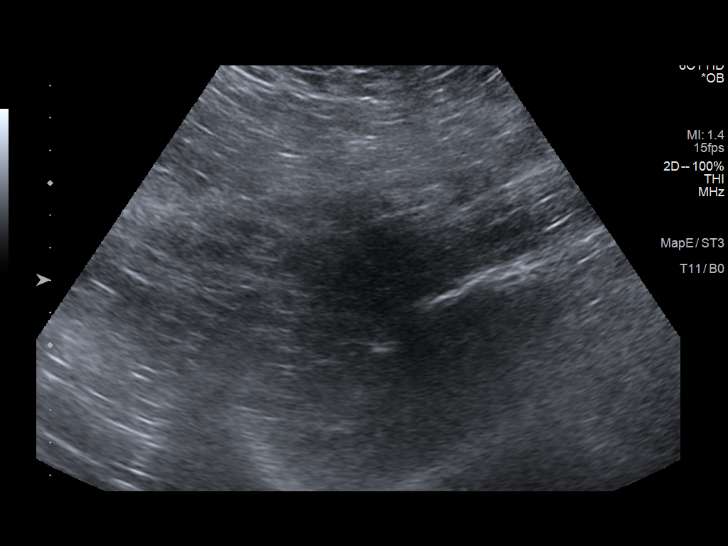
[im 62/98]
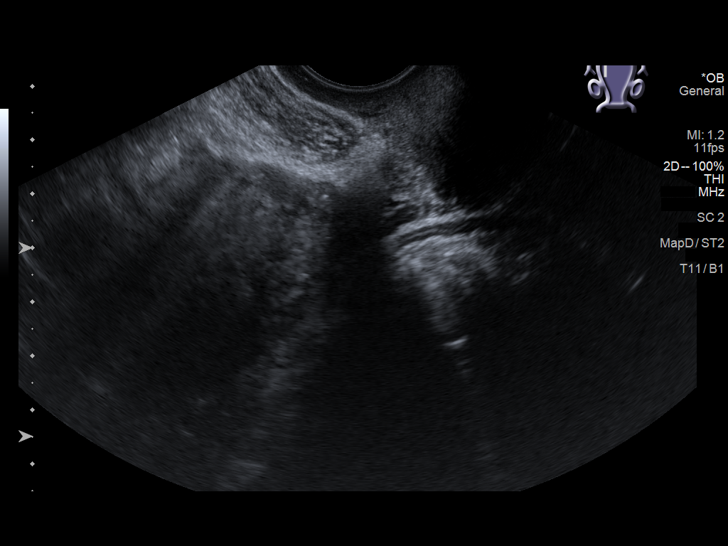
[im 69/98]
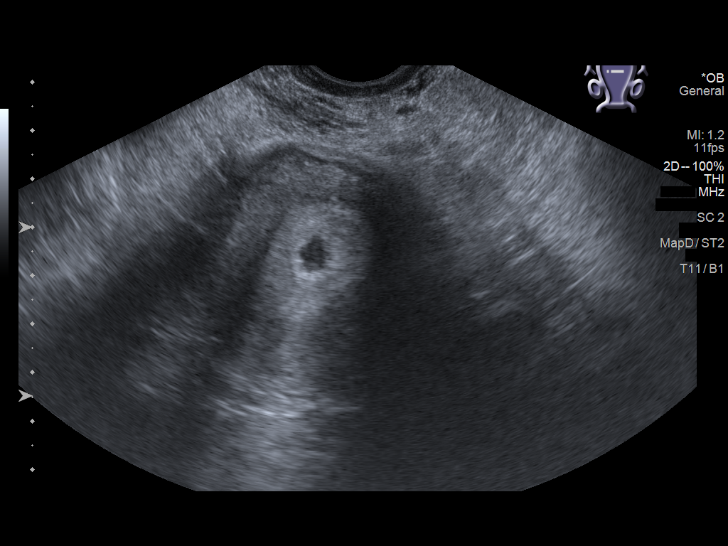
[im 76/98]
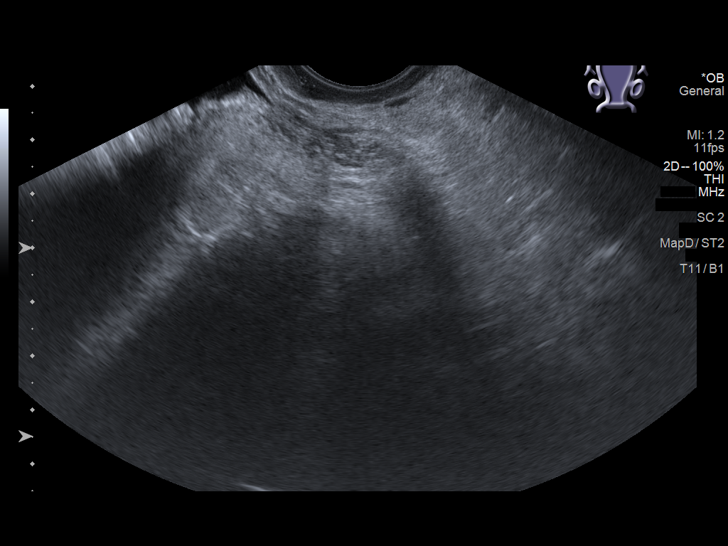
[im 83/98]
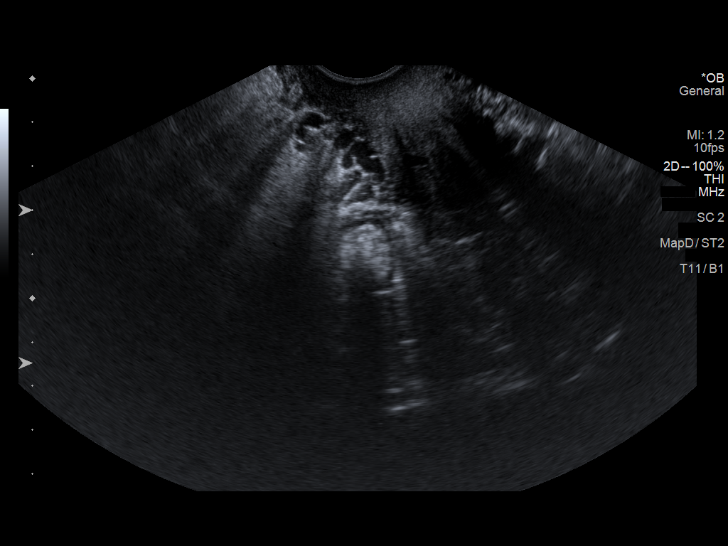
[im 90/98]
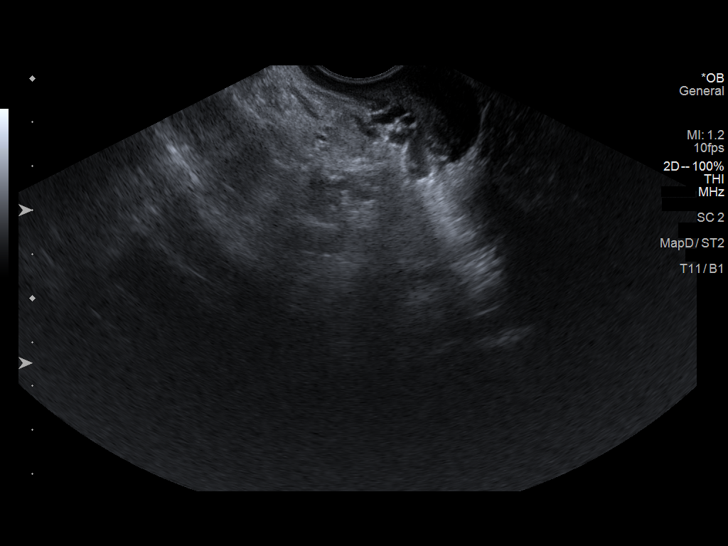
[im 98/98]
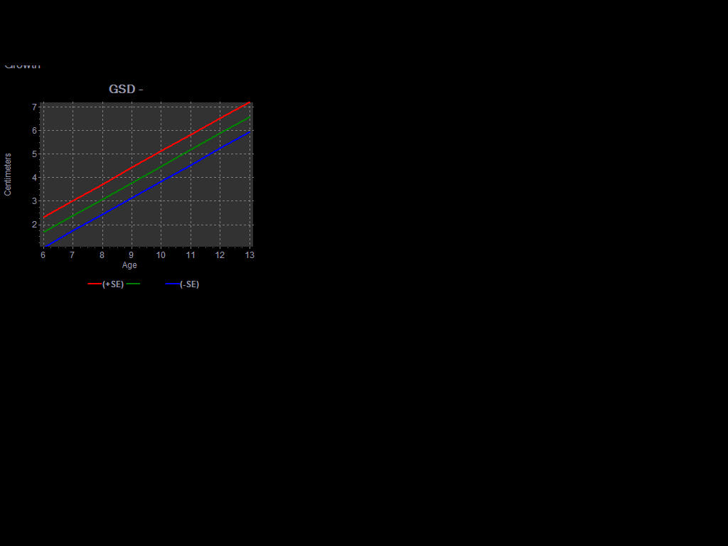

[14 of 28 positions shown; findings below may reference images not displayed]

FINDINGS: Intrauterine gestational sac: Single

Yolk sac:  Not Visualized.

Embryo:  Not Visualized.

MSD: 6 mm   5 w   2 d

Subchorionic hemorrhage:  None visualized.

Maternal uterus/adnexae: Normal appearance of right ovary. Left
ovary is surgically absent per patient history. No adnexal mass or
abnormal free fluid identified.
IMPRESSION: Single early 5 week intrauterine gestational sac. Suggest
correlation with serial b-hCG levels, and consider followup
ultrasound to assess viability in 14 days.

## 2023-03-21 IMAGING — US US OB COMP +14 WK
1 series · 13 of 28 positions shown · non-contrast
Comparison: none

CLINICAL DATA: Second trimester pregnancy for fetal anatomy survey.

EXAM:
OBSTETRICAL ULTRASOUND >14 WKS

[Series 1: us ob comp +14 wk · 0.23mm/px · 13 of 76 slices shown]
[im 3/76]
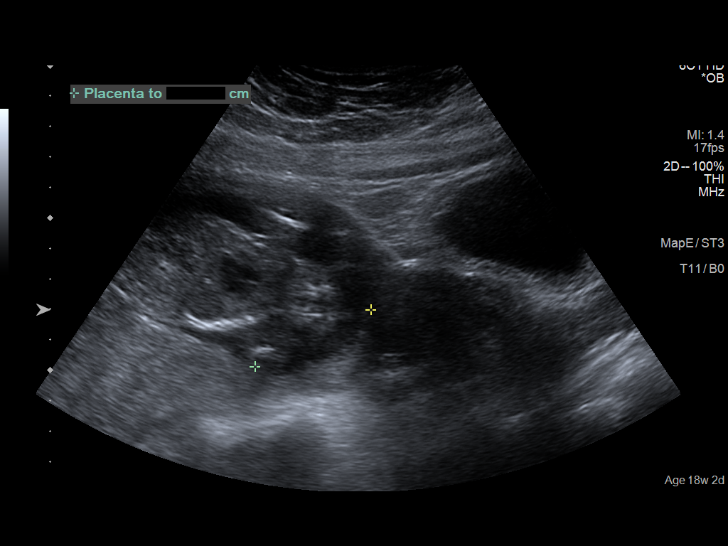
[im 9/76]
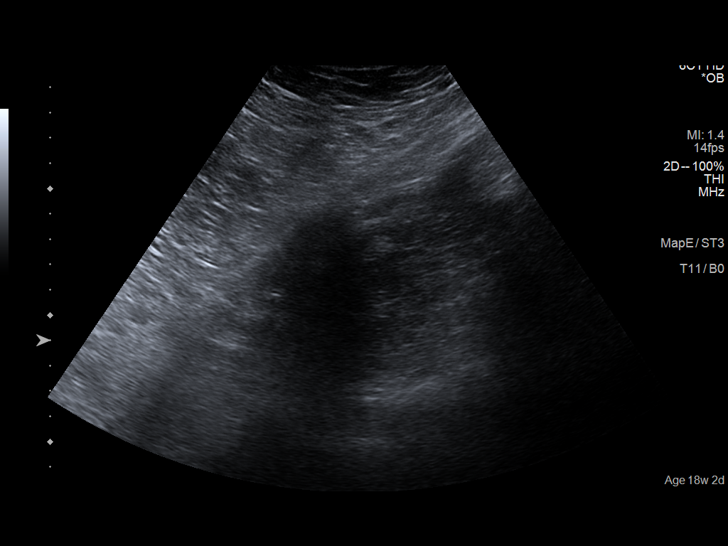
[im 14/76]
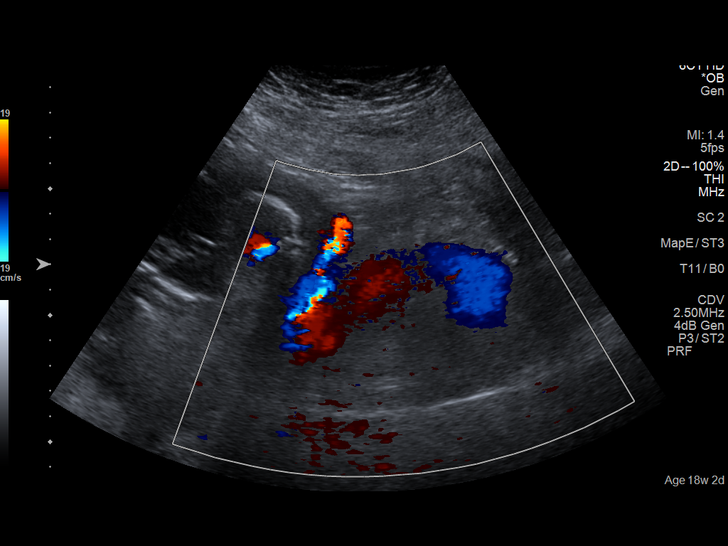
[im 20/76]
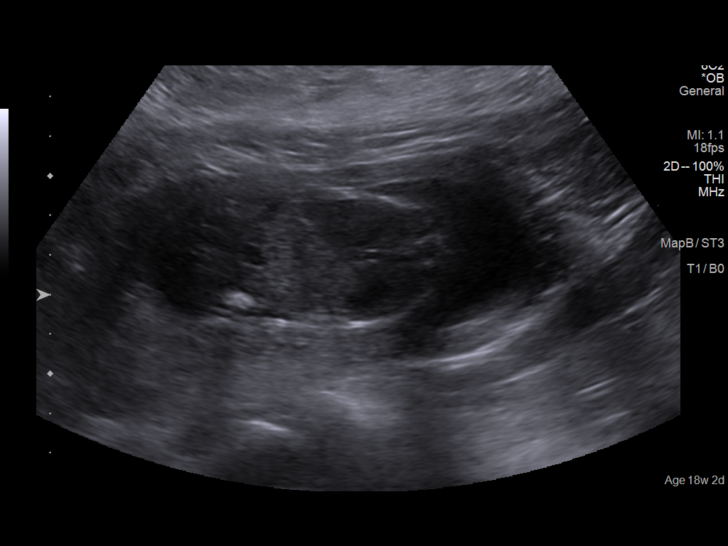
[im 26/76]
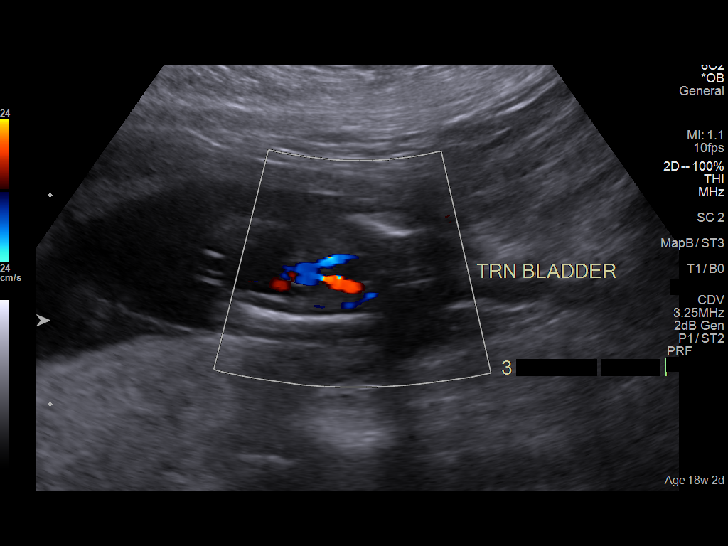
[im 31/76]
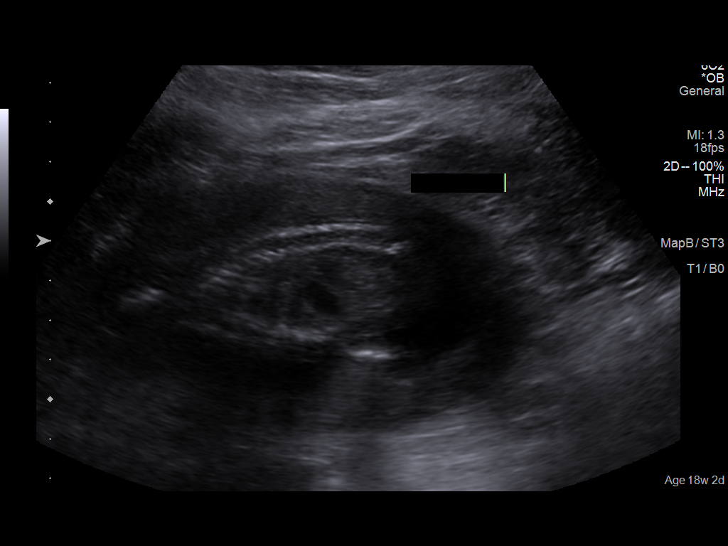
[im 39/76]
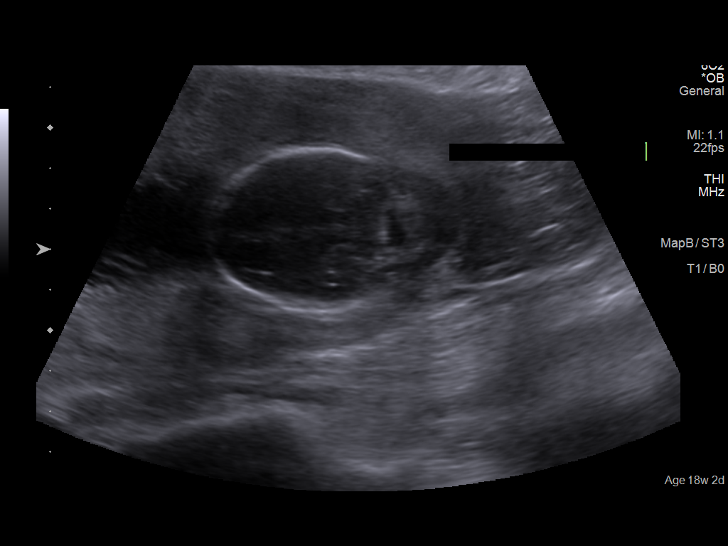
[im 45/76]
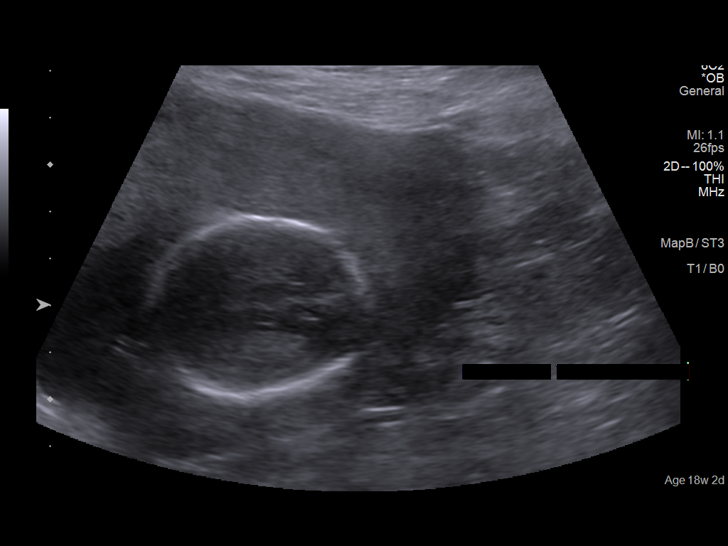
[im 51/76]
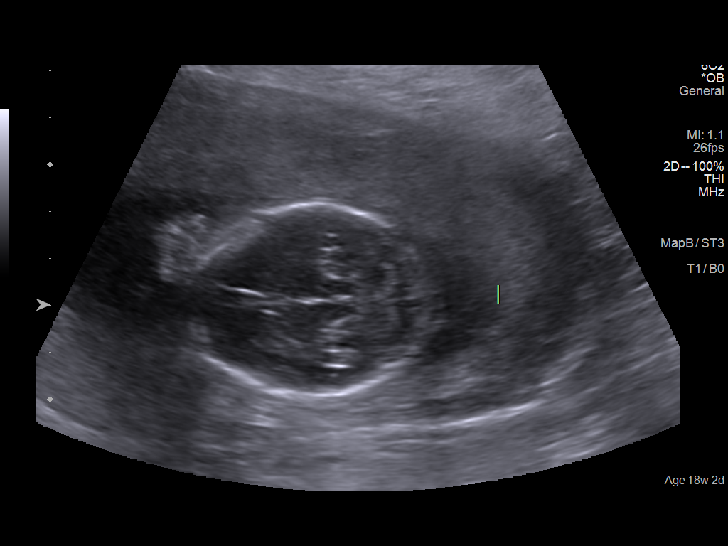
[im 56/76]
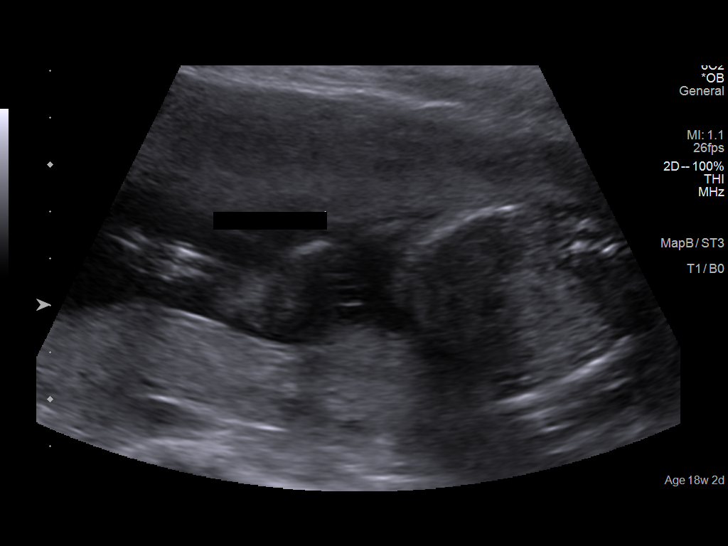
[im 62/76]
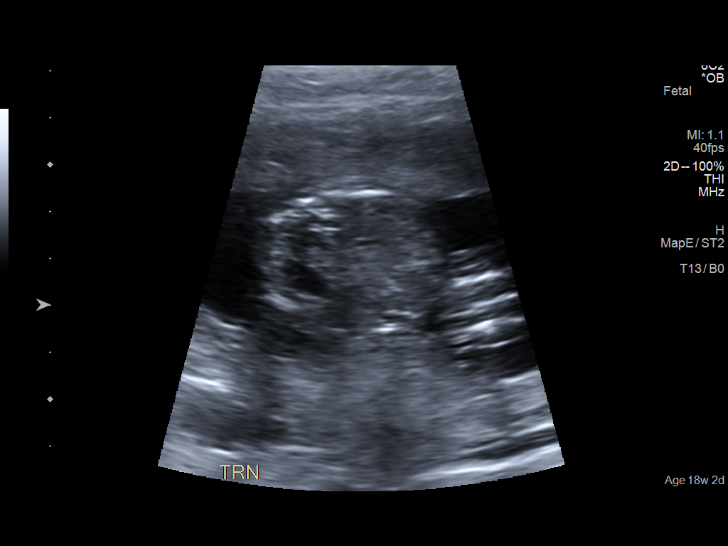
[im 67/76]
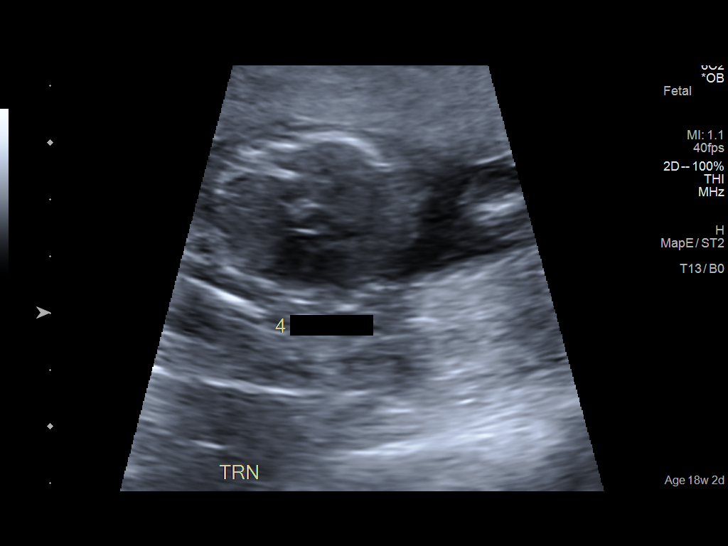
[im 73/76]
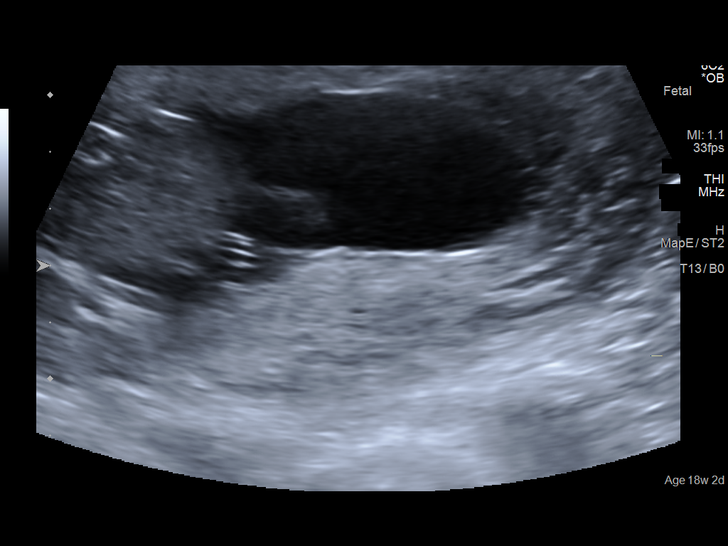

[13 of 28 positions shown; findings below may reference images not displayed]

FINDINGS: Number of Fetuses: 1

Heart Rate:  144 bpm

Movement: Yes

Presentation: Variable

Previa: No

Placental Location: Anterior and fundal

Amniotic Fluid (Subjective): Within normal limits

Amniotic Fluid (Objective):

Vertical pocket = 5.0cm

FETAL BIOMETRY

BPD: 4.0cm 18w 0d

HC:   15.0cm 18w 1d

AC:   12.3cm 18w 0d

FL:   2.7cm 18w 2d

Current Mean GA: 18w 1d US EDC: 12/26/2020

Assigned GA:  18w 2d Assigned EDC: 12/25/2020

FETAL ANATOMY

Lateral Ventricles: Appears normal

Thalami/CSP: Appears normal

Posterior Fossa:  Appears normal

Nuchal Region: Appears normal   NFT= 3.2 mm

Upper Lip: Appears normal

Spine: Not well visualized

4 Chamber Heart on Left: Not well visualized

LVOT: Not visualized

RVOT: Not visualized

Stomach on Left: Appears normal

3 Vessel Cord: Appears normal

Cord Insertion site: Appears normal

Kidneys: Appears normal

Bladder: Appears normal

Extremities: Appears normal

Sex: Not Visualized

Technically difficult due to: Fetal position and maternal habitus

Maternal Findings:

Cervix:  4.5 cm TA
IMPRESSION: Assigned GA currently 18 weeks 2 days.  Appropriate fetal growth.

No fetal anomalies identified, however, distal spine, four-chamber
heart, cardiac outflow tracts, and genitalia could not be
visualized. Followup ultrasound could be obtained in 3-4 weeks for
further evaluation.

## 2023-12-31 ENCOUNTER — Emergency Department (HOSPITAL_COMMUNITY)
Admission: EM | Admit: 2023-12-31 | Discharge: 2023-12-31 | Disposition: A | Discharge status: Home / Self Care | Attending: Emergency Medicine | Admitting: Emergency Medicine

## 2023-12-31 ENCOUNTER — Encounter (HOSPITAL_COMMUNITY): Payer: Self-pay | Admitting: *Deleted

## 2023-12-31 ENCOUNTER — Other Ambulatory Visit: Payer: Self-pay

## 2023-12-31 DIAGNOSIS — A084 Viral intestinal infection, unspecified: Secondary | ICD-10-CM | POA: Diagnosis not present

## 2023-12-31 DIAGNOSIS — R197 Diarrhea, unspecified: Secondary | ICD-10-CM | POA: Diagnosis present

## 2023-12-31 LAB — I-STAT CHEM 8, ED
BUN: 17 mg/dL (ref 6–20)
Calcium, Ion: 1.04 mmol/L — ABNORMAL LOW (ref 1.15–1.40)
Chloride: 107 mmol/L (ref 98–111)
Creatinine, Ser: 0.8 mg/dL (ref 0.44–1.00)
Glucose, Bld: 99 mg/dL (ref 70–99)
HCT: 43 % (ref 36.0–46.0)
Hemoglobin: 14.6 g/dL (ref 12.0–15.0)
Potassium: 5.3 mmol/L — ABNORMAL HIGH (ref 3.5–5.1)
Sodium: 139 mmol/L (ref 135–145)
TCO2: 24 mmol/L (ref 22–32)

## 2023-12-31 LAB — COMPREHENSIVE METABOLIC PANEL WITH GFR
ALT: 22 U/L (ref 0–44)
AST: 19 U/L (ref 15–41)
Albumin: 4 g/dL (ref 3.5–5.0)
Alkaline Phosphatase: 93 U/L (ref 38–126)
Anion gap: 9 (ref 5–15)
BUN: 13 mg/dL (ref 6–20)
CO2: 25 mmol/L (ref 22–32)
Calcium: 8.8 mg/dL — ABNORMAL LOW (ref 8.9–10.3)
Chloride: 106 mmol/L (ref 98–111)
Creatinine, Ser: 0.71 mg/dL (ref 0.44–1.00)
GFR, Estimated: >60 mL/min (ref >60)
Glucose, Bld: 97 mg/dL (ref 70–99)
Potassium: 4.3 mmol/L (ref 3.5–5.1)
Sodium: 139 mmol/L (ref 135–145)
Total Bilirubin: 0.5 mg/dL (ref 0.0–1.2)
Total Protein: 7.2 g/dL (ref 6.5–8.1)

## 2023-12-31 LAB — RESP PANEL BY RT-PCR (RSV, FLU A&B, COVID)  RVPGX2
Influenza A by PCR: NEGATIVE
Influenza B by PCR: NEGATIVE
Resp Syncytial Virus by PCR: NEGATIVE
SARS Coronavirus 2 by RT PCR: NEGATIVE

## 2023-12-31 MED ORDER — ONDANSETRON 4 MG PO TBDP
4.0000 mg | ORAL_TABLET | Freq: Once | ORAL | Status: AC
  Administered 2023-12-31: 4 mg via ORAL
  Filled 2023-12-31: qty 1

## 2023-12-31 MED ORDER — ONDANSETRON HCL 4 MG PO TABS: 4.0000 mg | ORAL_TABLET | Freq: Three times a day (TID) | ORAL | 0 refills | Status: AC | PRN

## 2023-12-31 NOTE — ED Triage Notes (Signed)
 Diarrhea x 2 days. States took imodium this morning after having 2 episodes of diarrhea. Denies vomiting or fever.

## 2023-12-31 NOTE — ED Provider Notes (Signed)
 [place] [facility] AT [facility] Provider Note   CSN: [id number] Arrival date & time: 12/31/23  9064     Patient presents with: Diarrhea   Ashley Moyer is a 33 y.o. female presenting for evaluation of nausea without emesis along with diarrhea for the past 2 days.  She denies fevers or chills, she does endorse lower abdominal cramping which improves after bowel movement.  She multiple episodes of diarrhea, including fecal incontinence secondary to urgency.  However she took Imodium around 2 AM today and since this medication she has had no further diarrhea.  She feels generally well, still has some mild nausea, has been able to tolerate fluid intake.  Of note she has 3 other children, 1 here for evaluation of similar symptoms.  2 other children had same symptoms for 24 hours but theirs have resolved.  She denies dizziness, weakness, chest pain, shortness of breath.  No recent travel, no recognized consumption of improperly cooked food.   The history is provided by the patient.       Prior to Admission medications   Medication Sig Start Date End Date Taking? Authorizing Provider  ondansetron  (ZOFRAN ) 4 MG tablet Take 1 tablet (4 mg total) by mouth every 8 (eight) hours as needed for nausea or vomiting. 12/31/23  Yes Dawnna Gritz, PA-C  methocarbamol  (ROBAXIN ) 500 MG tablet Take 1 tablet (500 mg total) by mouth every 8 (eight) hours as needed for muscle spasms. 04/29/21   Arlander Charleston, MD    Allergies: Ibuprofen  and Tape    Review of Systems  Constitutional:  Negative for chills and fever.  HENT:  Negative for congestion and sore throat.   Eyes: Negative.   Respiratory:  Negative for cough, chest tightness and shortness of breath.   Cardiovascular:  Negative for chest pain.  Gastrointestinal:  Positive for abdominal pain, diarrhea and nausea. Negative for abdominal distention and vomiting.  Genitourinary: Negative.   Musculoskeletal:  Negative for  arthralgias, joint swelling and neck pain.  Skin: Negative.  Negative for rash and wound.  Neurological:  Negative for dizziness, weakness, light-headedness, numbness and headaches.  Psychiatric/Behavioral: Negative.      Updated Vital Signs BP 132/86 (BP Location: Left Arm)   Pulse 84   Temp 98 F (36.7 C) (Oral)   Resp (!) 22   Ht 5' 6 (1.676 m)   Wt (!) 145.2 kg   LMP 12/10/2023 (Approximate)   SpO2 98%   BMI 51.65 kg/m   Physical Exam Vitals and nursing note reviewed.  Constitutional:      Appearance: She is well-developed.  HENT:     Head: Normocephalic and atraumatic.  Eyes:     Conjunctiva/sclera: Conjunctivae normal.  Cardiovascular:     Rate and Rhythm: Normal rate and regular rhythm.     Heart sounds: Normal heart sounds.  Pulmonary:     Effort: Pulmonary effort is normal.     Breath sounds: Normal breath sounds. No wheezing.  Abdominal:     General: Bowel sounds are normal. There is no distension.     Palpations: Abdomen is soft.     Tenderness: There is no abdominal tenderness. There is no guarding.  Musculoskeletal:        General: Normal range of motion.     Cervical back: Normal range of motion.  Skin:    General: Skin is warm and dry.  Neurological:     Mental Status: She is alert and oriented to person, place, and time.     (  all labs ordered are listed, but only abnormal results are displayed) Labs Reviewed  COMPREHENSIVE METABOLIC PANEL WITH GFR - Abnormal; Notable for the following components:      Result Value   Calcium  8.8 (*)    All other components within normal limits  I-STAT CHEM 8, ED - Abnormal; Notable for the following components:   Potassium 5.3 (*)    Calcium , Ion 1.04 (*)    All other components within normal limits  RESP PANEL BY RT-PCR (RSV, FLU A&B, COVID)  [id number]    EKG: None  Radiology: No results found.   Procedures   Medications Ordered in the ED  ondansetron  (ZOFRAN -ODT) disintegrating tablet 4 mg (4 mg  Oral Given 12/31/23 1056)                                    Medical Decision Making Patient presenting with nausea without emesis, diarrhea, mild lower abdominal cramping improved after bowel movements, has been significantly improved since taking Imodium at 2 AM today.  Differential diagnosis including viral versus bacterial gastroenteritis, constipation with overflow diarrhea, however she does not have that history, given the fact she has had 3 of her children with similar symptoms, suspect this is a viral GI bug in her household.  Of note she also denies recent antibiotic exposure.  Initially an i-STAT was obtained in order to confirm normal electrolytes and no significant dehydration, her potassium was 5.3, therefore a CMET was repeated to determine if this elevation was real, her repeat potassium is normal range at 4.3.  Other electrolytes also normal except for borderline hypocalcemia at 8.8.  Her respiratory panel is negative.  She was encouraged to continue Imodium as needed return of diarrhea, she was prescribed some Zofran .  Both she and her son were treated with Zofran  here after which they both tolerated p.o. challenge without return of symptoms.  Return precautions were outlined.  Amount and/or Complexity of Data Reviewed Labs: ordered.    Details: Per above.  Risk Prescription drug management.   Patient     Final diagnoses:  Viral gastroenteritis    ED Discharge Orders          Ordered    ondansetron  (ZOFRAN ) 4 MG tablet  Every 8 hours PRN        12/31/23 1256               Jenessa Gillingham, PA-C 12/31/23 1444    Towana Ozell BROCKS, MD 12/31/23 1720

## 2023-12-31 NOTE — Discharge Instructions (Signed)
 Your lab test and exam are reassuring.  I suspect this is a viral GI bug that should run its course.  I encourage you to continue using your Imodium if needed for diarrhea, only take this medication if the symptoms persist.  I have also prescribed you some Zofran  to help with the nausea.  Rest and make sure you are drinking plenty of fluids.
